# Patient Record
Sex: Female | Born: 1965 | Hispanic: Yes | Marital: Single | Smoking: Never smoker
Health system: Southern US, Community
[De-identification: ages and names within clinical notes are randomized; demographics above are authoritative.]

## PROBLEM LIST (undated history)

## (undated) DIAGNOSIS — Z9289 Personal history of other medical treatment: Secondary | ICD-10-CM

## (undated) DIAGNOSIS — N189 Chronic kidney disease, unspecified: Secondary | ICD-10-CM

## (undated) DIAGNOSIS — Z972 Presence of dental prosthetic device (complete) (partial): Secondary | ICD-10-CM

## (undated) DIAGNOSIS — D649 Anemia, unspecified: Secondary | ICD-10-CM

## (undated) DIAGNOSIS — R319 Hematuria, unspecified: Secondary | ICD-10-CM

## (undated) HISTORY — PX: TUBAL LIGATION: SHX77

## (undated) HISTORY — DX: Hematuria, unspecified: R31.9

## (undated) HISTORY — DX: Chronic kidney disease, unspecified: N18.9

## (undated) HISTORY — DX: Anemia, unspecified: D64.9

## (undated) SURGERY — ULTRASOUND, LOWER GI TRACT, ENDOSCOPIC
Anesthesia: General

---

## 2005-04-24 ENCOUNTER — Ambulatory Visit: Payer: Self-pay | Admitting: Obstetrics and Gynecology

## 2008-08-28 ENCOUNTER — Ambulatory Visit: Payer: Self-pay

## 2008-11-27 ENCOUNTER — Ambulatory Visit: Payer: Self-pay

## 2011-09-09 ENCOUNTER — Ambulatory Visit: Payer: Self-pay | Admitting: Internal Medicine

## 2016-02-12 ENCOUNTER — Inpatient Hospital Stay
Admission: EM | Admit: 2016-02-12 | Discharge: 2016-02-13 | DRG: 812 | Disposition: A | Discharge status: Home / Self Care | Payer: Medicaid Other | Attending: Internal Medicine | Admitting: Internal Medicine

## 2016-02-12 ENCOUNTER — Inpatient Hospital Stay: Payer: Medicaid Other

## 2016-02-12 ENCOUNTER — Encounter: Payer: Self-pay | Admitting: Emergency Medicine

## 2016-02-12 DIAGNOSIS — D509 Iron deficiency anemia, unspecified: Secondary | ICD-10-CM | POA: Diagnosis present

## 2016-02-12 DIAGNOSIS — Z9289 Personal history of other medical treatment: Secondary | ICD-10-CM

## 2016-02-12 DIAGNOSIS — R52 Pain, unspecified: Secondary | ICD-10-CM | POA: Diagnosis present

## 2016-02-12 DIAGNOSIS — K862 Cyst of pancreas: Secondary | ICD-10-CM | POA: Insufficient documentation

## 2016-02-12 DIAGNOSIS — D500 Iron deficiency anemia secondary to blood loss (chronic): Secondary | ICD-10-CM | POA: Insufficient documentation

## 2016-02-12 DIAGNOSIS — Z9851 Tubal ligation status: Secondary | ICD-10-CM

## 2016-02-12 DIAGNOSIS — D649 Anemia, unspecified: Secondary | ICD-10-CM | POA: Diagnosis present

## 2016-02-12 DIAGNOSIS — N920 Excessive and frequent menstruation with regular cycle: Secondary | ICD-10-CM | POA: Diagnosis present

## 2016-02-12 HISTORY — DX: Personal history of other medical treatment: Z92.89

## 2016-02-12 LAB — HEPATIC FUNCTION PANEL
ALK PHOS: 54 U/L (ref 38–126)
ALT: 30 U/L (ref 14–54)
AST: 22 U/L (ref 15–41)
Albumin: 3.1 g/dL — ABNORMAL LOW (ref 3.5–5.0)
BILIRUBIN INDIRECT: 1.1 mg/dL — AB (ref 0.3–0.9)
Bilirubin, Direct: 0.2 mg/dL (ref 0.1–0.5)
TOTAL PROTEIN: 5.6 g/dL — AB (ref 6.5–8.1)
Total Bilirubin: 1.3 mg/dL — ABNORMAL HIGH (ref 0.3–1.2)

## 2016-02-12 LAB — IRON AND TIBC: TIBC: 401 ug/dL (ref 250–450)

## 2016-02-12 LAB — CBC
HEMATOCRIT: 16.1 % — AB (ref 35.0–47.0)
HEMOGLOBIN: 4.6 g/dL — AB (ref 12.0–16.0)
MCH: 14.9 pg — ABNORMAL LOW (ref 26.0–34.0)
MCHC: 28.3 g/dL — ABNORMAL LOW (ref 32.0–36.0)
MCV: 52.7 fL — AB (ref 80.0–100.0)
Platelets: 351 10*3/uL (ref 150–440)
RBC: 3.05 MIL/uL — ABNORMAL LOW (ref 3.80–5.20)
RDW: 20.3 % — ABNORMAL HIGH (ref 11.5–14.5)
WBC: 4.7 10*3/uL (ref 3.6–11.0)

## 2016-02-12 LAB — RETICULOCYTES
RBC.: 3.09 MIL/uL — ABNORMAL LOW (ref 3.80–5.20)
RETIC CT PCT: 3.1 % (ref 0.4–3.1)
Retic Count, Absolute: 95.8 10*3/uL (ref 19.0–183.0)

## 2016-02-12 LAB — BASIC METABOLIC PANEL
ANION GAP: 5 (ref 5–15)
BUN: 14 mg/dL (ref 6–20)
CHLORIDE: 111 mmol/L (ref 101–111)
CO2: 20 mmol/L — ABNORMAL LOW (ref 22–32)
Calcium: 8.6 mg/dL — ABNORMAL LOW (ref 8.9–10.3)
Creatinine, Ser: 1.15 mg/dL — ABNORMAL HIGH (ref 0.44–1.00)
GFR calc Af Amer: >60 mL/min (ref >60)
GFR, EST NON AFRICAN AMERICAN: 55 mL/min — AB (ref >60)
Glucose, Bld: 104 mg/dL — ABNORMAL HIGH (ref 65–99)
POTASSIUM: 4 mmol/L (ref 3.5–5.1)
SODIUM: 136 mmol/L (ref 135–145)

## 2016-02-12 LAB — HEMOGLOBIN: HEMOGLOBIN: 6.3 g/dL — AB (ref 12.0–16.0)

## 2016-02-12 LAB — ABO/RH: ABO/RH(D): A POS

## 2016-02-12 LAB — VITAMIN B12: VITAMIN B 12: 402 pg/mL (ref 180–914)

## 2016-02-12 LAB — LIPASE, BLOOD: LIPASE: 29 U/L (ref 11–51)

## 2016-02-12 LAB — PREPARE RBC (CROSSMATCH)

## 2016-02-12 LAB — FERRITIN: FERRITIN: 1 ng/mL — AB (ref 11–307)

## 2016-02-12 LAB — FOLATE: FOLATE: 14.5 ng/mL (ref >5.9)

## 2016-02-12 MED ORDER — SODIUM CHLORIDE 0.9% FLUSH
3.0000 mL | Freq: Two times a day (BID) | INTRAVENOUS | Status: DC
  Administered 2016-02-12 – 2016-02-13 (×3): 3 mL via INTRAVENOUS

## 2016-02-12 MED ORDER — PANTOPRAZOLE SODIUM 40 MG PO TBEC
40.0000 mg | DELAYED_RELEASE_TABLET | Freq: Every day | ORAL | Status: DC
  Administered 2016-02-12 – 2016-02-13 (×2): 40 mg via ORAL
  Filled 2016-02-12 (×2): qty 1

## 2016-02-12 MED ORDER — SENNOSIDES-DOCUSATE SODIUM 8.6-50 MG PO TABS: 1.0000 | ORAL_TABLET | Freq: Every evening | ORAL | Status: DC | PRN

## 2016-02-12 MED ORDER — ACETAMINOPHEN 325 MG PO TABS: 650.0000 mg | ORAL_TABLET | Freq: Four times a day (QID) | ORAL | Status: DC | PRN

## 2016-02-12 MED ORDER — ACETAMINOPHEN 650 MG RE SUPP: 650.0000 mg | Freq: Four times a day (QID) | RECTAL | Status: DC | PRN

## 2016-02-12 MED ORDER — SODIUM CHLORIDE 0.9 % IV SOLN
10.0000 mL/h | Freq: Once | INTRAVENOUS | Status: AC
  Administered 2016-02-12: 10 mL/h via INTRAVENOUS

## 2016-02-12 MED ORDER — SODIUM CHLORIDE 0.9 % IV SOLN
Freq: Once | INTRAVENOUS | Status: AC
  Administered 2016-02-13: 02:00:00 via INTRAVENOUS

## 2016-02-12 MED ORDER — ALUM & MAG HYDROXIDE-SIMETH 200-200-20 MG/5ML PO SUSP: 30.0000 mL | Freq: Four times a day (QID) | ORAL | Status: DC | PRN

## 2016-02-12 MED ORDER — ONDANSETRON HCL 4 MG/2ML IJ SOLN: 4.0000 mg | Freq: Four times a day (QID) | INTRAMUSCULAR | Status: DC | PRN

## 2016-02-12 MED ORDER — ONDANSETRON HCL 4 MG PO TABS: 4.0000 mg | ORAL_TABLET | Freq: Four times a day (QID) | ORAL | Status: DC | PRN

## 2016-02-12 NOTE — ED Notes (Signed)
Pt presents with abdominal pain RUQ x 1 month. She was seen at PCP and had a follow up yesterday with bloodwork. Was told she needed to come to ED, as she is anemic. Pt reports black, tarry stools at times, and fatigue x 3 weeks. Up until 1 month ago, pt walked 3 miles per day, but has been too tired to do that lately.

## 2016-02-12 NOTE — ED Notes (Signed)
Dr Derrill KayGoodman at bedside; rectal exam performed. Interpreter present as Dr, Derrill KayGoodman discusses blood transfusion risks and benefits.

## 2016-02-12 NOTE — Consult Note (Addendum)
Consult History and Physical   SERVICE: Gynecology  Patient Name: Lindsay Bates Patient MRN:   [id number]  CC: low hemoglobin  HPI: Lindsay Bates is a 50 y.o. who presents from her PCP's office with low hemoglobin on routine labs.  She was evaluated and admitted by our hospitalists with a H/H of 4.6/16.1 and has been thus far given 2u PRBCs. So far no source of acute blood loss has been identified, and there is question if her heavy menses may have been contributing to this.  I was consulted to offer possible etiology of her anemia from a gynecology perspective.    Lindsay Bates is a [id number] s/p BTL 10 yrs ago who has always had heavy menses, which would usually last 3-5 days.  She has had an increase in the volume of blood loss in the last 3 cycles.  She says she fills about pad an hour on the first two days of her period and then it lightens up.  She has no intramenstrual bleeding.  She has had no recent trauma, no melena, no evidence of recent bleeding.  Her only symptom of note was a slight dyspnea on exertion when pushing boxes - she is a Neurosurgeon and makes socks.  She does endorse hearing her heartbeat in her ears, but no ringing in the ears, no lightheaded or dizziness, no feeling faint, no syncope, and no weakness.   She is s/p 2 u PRBCs with repeat H/H pending.  She states she feels Bates after the transfusion.    Although Lindsay Bates speaks limited English, we were able to converse without difficulty.  Review of Systems: positives in bold GEN:   fevers, chills, weight changes, appetite changes, fatigue, night sweats HEENT:  HA, vision changes, hearing loss, congestion, rhinorrhea, sinus pressure, dysphagia CV:   CP, palpitations PULM:  SOB, cough GI:  abd pain, N/V/D/C GU:  dysuria, urgency, frequency MSK:  arthralgias, myalgias, back pain, swelling SKIN:  rashes, color changes, pallor NEURO:  numbness, weakness, tingling, seizures, dizziness, tremors PSYCH:  depression,  anxiety, behavioral problems, confusion  HEME/LYMPH:  easy bruising or bleeding ENDO:  heat/cold intolerance  Past Obstetrical History: OB History    3x SVD no complcations, oldest child 74, youngest 79.      Past Gynecologic History: Patient's last menstrual period was 02/07/2016 (exact date). Menstrual frequency Q 28days lasting3-5 days requiring 8 pads/day, last pap was 42yr ago, gets them regularly, no abnormals.  No history of STI.  BTL performed 10 years ago.  Past Medical History: History reviewed. No pertinent past medical history.  Past Surgical History:  History reviewed. No pertinent past surgical history.  Family History:  family history is not on file.  Social History:  Social History   Social History  . Marital Status: Single - lives with boyfriend of 16 years, have 2 children together.    Spouse Name: N/A  . Number of Children: N/A  . Years of Education: N/A   Occupational History  . Seamstress   Social History Main Topics  . Smoking status: Never Smoker   . Smokeless tobacco: Not on file  . Alcohol Use: No  . Drug Use: No  . Sexual Activity: active   Other Topics Concern  . Not on file   Social History Narrative  . No narrative on file    Home Medications:  Medications reconciled in EPIC  No current facility-administered medications on file prior to encounter.   No current outpatient prescriptions on file prior to  encounter.    Allergies:  No Known Allergies  Physical Exam:  Temp:  [97.6 F (36.4 C)-98.9 F (37.2 C)] 98.1 F (36.7 C) (03/15 1943) Pulse Rate:  [61-77] 73 (03/15 1943) Resp:  [15-20] 15 (03/15 1943) BP: (116-138)/(58-78) 125/71 mmHg (03/15 1943) SpO2:  [97 %-100 %] 100 % (03/15 1943) Weight:  [63.05 kg (139 lb)-64.864 kg (143 lb)] 64.864 kg (143 lb) (03/15 1304)   General Appearance:  Well developed, well nourished, no acute distress, alert and oriented x3 HEENT:  Normocephalic atraumatic, extraocular movements  intact, moist mucous membranes Cardiovascular:  Normal S1/S2, regular rate and rhythm, no murmurs Pulmonary:  clear to auscultation, no wheezes, rales or rhonchi, symmetric air entry, good air exchange Abdomen:  Bowel sounds present, soft, nontender, nondistended, no abnormal masses, no epigastric pain Extremities:  Full range of motion, no pedal edema, 2+ distal pulses, no tenderness Skin:  normal coloration and turgor, no rashes, no suspicious skin lesions noted  Neurologic:  Cranial nerves 2-12 grossly intact, normal muscle tone, strength 5/5 all four extremities Psychiatric:  Normal mood and affect, appropriate, no AH/VH Pelvic:  NEFG, no vulvar masses or lesions, normal vaginal mucosa, no vaginal bleeding or discharge, cervix without lesions or erythema, small, anteverted uterus, no adnexal masses appreciated, no pelvic organ prolapse   Labs/Studies:   Results for orders placed or performed during the hospital encounter of 02/12/16 (from the past 24 hour(s))  Basic metabolic panel     Status: Abnormal   Collection Time: 02/12/16  9:26 AM  Result Value Ref Range   Sodium 136 135 - 145 mmol/L   Potassium 4.0 3.5 - 5.1 mmol/L   Chloride 111 101 - 111 mmol/L   CO2 20 (L) 22 - 32 mmol/L   Glucose, Bld 104 (H) 65 - 99 mg/dL   BUN 14 6 - 20 mg/dL   Creatinine, Ser 1.61 (H) 0.44 - 1.00 mg/dL   Calcium 8.6 (L) 8.9 - 10.3 mg/dL   GFR calc non Af Amer 55 (L) >60 mL/min   GFR calc Af Amer >60 >60 mL/min   Anion gap 5 5 - 15  CBC     Status: Abnormal   Collection Time: 02/12/16  9:26 AM  Result Value Ref Range   WBC 4.7 3.6 - 11.0 K/uL   RBC 3.05 (L) 3.80 - 5.20 MIL/uL   Hemoglobin 4.6 (LL) 12.0 - 16.0 g/dL   HCT 09.6 (L) 04.5 - 40.9 %   MCV 52.7 (L) 80.0 - 100.0 fL   MCH 14.9 (L) 26.0 - 34.0 pg   MCHC 28.3 (L) 32.0 - 36.0 g/dL   RDW 81.1 (H) 91.4 - 78.2 %   Platelets 351 150 - 440 K/uL  ABO/Rh     Status: None   Collection Time: 02/12/16  9:53 AM  Result Value Ref Range    ABO/RH(D) A POS   Prepare RBC     Status: None   Collection Time: 02/12/16  9:55 AM  Result Value Ref Range   Order Confirmation      CALLED MARY NEEDHAM @ 1037 ON 02/12/2016 NO RED BLOOD EXCHANGE NEEDED. CALLED GREG MOYER @ 1107 ON 02/12/2016 HE STATED THAT THE UNITS WERE GOING TO BE RECEIVED BY PATIENT ON THE FLOOR. CALLED GREG MOYER THAT UNITS WERE READY FOR PICK UP.  Type and screen [facility]     Status: None (Preliminary result)   Collection Time: 02/12/16 10:04 AM  Result Value Ref Range   ABO/RH(D) A  POS    Antibody Screen NEG    Sample Expiration 02/15/2016    Unit Number [id number]    Blood Component Type RBC, LR IRR    Unit division 00    Status of Unit ISSUED    Transfusion Status OK TO TRANSFUSE    Crossmatch Result Compatible    Unit Number [id number]    Blood Component Type RED CELLS,LR    Unit division 00    Status of Unit ISSUED    Transfusion Status OK TO TRANSFUSE    Crossmatch Result Compatible   Hepatic function panel     Status: Abnormal   Collection Time: 02/12/16 10:07 AM  Result Value Ref Range   Total Protein 5.6 (L) 6.5 - 8.1 g/dL   Albumin 3.1 (L) 3.5 - 5.0 g/dL   AST 22 15 - 41 U/L   ALT 30 14 - 54 U/L   Alkaline Phosphatase 54 38 - 126 U/L   Total Bilirubin 1.3 (H) 0.3 - 1.2 mg/dL   Bilirubin, Direct 0.2 0.1 - 0.5 mg/dL   Indirect Bilirubin 1.1 (H) 0.3 - 0.9 mg/dL  Lipase, blood     Status: None   Collection Time: 02/12/16 10:07 AM  Result Value Ref Range   Lipase 29 11 - 51 U/L  Ferritin     Status: Abnormal   Collection Time: 02/12/16 10:07 AM  Result Value Ref Range   Ferritin 1 (L) 11 - 307 ng/mL  Folate     Status: None   Collection Time: 02/12/16 10:07 AM  Result Value Ref Range   Folate 14.5 >5.9 ng/mL  Iron and TIBC     Status: Abnormal   Collection Time: 02/12/16 10:07 AM  Result Value Ref Range   Iron <5 (L) 28 - 170 ug/dL   TIBC 308401 657250 - 846450 ug/dL   Saturation Ratios NOT CALCULATED 10.4 - 31.8 %    UIBC NOT CALCULATED ug/dL  Reticulocytes     Status: Abnormal   Collection Time: 02/12/16 10:07 AM  Result Value Ref Range   Retic Ct Pct 3.1 0.4 - 3.1 %   RBC. 3.09 (L) 3.80 - 5.20 MIL/uL   Retic Count, Manual 95.8 19.0 - 183.0 K/uL  Vitamin B12     Status: None   Collection Time: 02/12/16 10:11 AM  Result Value Ref Range   Vitamin B-12 402 180 - 914 pg/mL  Hemoglobin     Status: Abnormal   Collection Time: 02/12/16  9:07 PM  Result Value Ref Range   Hemoglobin 6.3 (L) 12.0 - 16.0 g/dL     TVUS:  Not done Other Imaging: [place]as Abdomen Limited Ruq  02/12/2016  CLINICAL DATA:  Abdominal pain for 3 weeks, no nausea or vomiting EXAM: US ABDOMEN LIMITED - RIGHT UPPER QUADRANT COMPARISON:  None FINDINGS: Gallbladder: Normally distended without stones or wall thickening. No pericholecystic fluid or sonographic Murphy sign. Common bile duct: Diameter: Normal caliber 2 mm diameter Liver: Normal echogenicity without mass.  Hepatopetal portal venous flow. No RIGHT upper quadrant free fluid. Question cystic lesion at pancreatic head, septated, 2.2 x 1.6 x 1.3 cm. IMPRESSION: No hepatobiliary abnormalities identified. 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 Electronically Signed   By: Ulyses SouthwardMark  Boles M.D.   On: 02/12/2016 11:11     Assessment / Plan:   Mahealani Erekson is a 50 y.o. G3P3 with chronic severe iron deficiency anemia.  1. Anemia:  Based on lab studies and clinical history this is not an acute blood loss situation.  She has likely been anemic for some time and has been physically adjusting to a very low hemoglobin.  As such, she is only symptomatic when pushed in questioning.  Her vital signs do not reflect an acute situation either.  While a gynecologic source is a valid question, further investigation must be made for another etiology (possibly compounding) such as poor absorption, poor  nutrition, malignancy, and/or a hematologic synthesis or iron storage deficiency.  A heme consult should be considered.  After 2 units, she has had adequate rise to 6.3 but this is still grossly anemic.  Another 2 units would suit her, as well as an iron or EPO infusion prior to discharge.  2. Gynecology:  Her heavy menses are likely contributing to her current anemia, but there is no treatment that needs to be started as an inpatient or emergently, as she is not currently bleeding. I would like to obtain a pelvic ultrasound which can be done before discharge, which I have ordered.  Her exam is not concerning.   I have asked Ms. Halberg to follow up with me in the week or two after discharge so we can address her heavy menses.  I am signing off while she remains inpatient, but certainly am curious to learn what further investigation finds.  If I can be of any help during this process please do not hesitate to contact me directly.    Total time invested in this patient's care, 60 minutes with >7mins face to face.   Thank you for the opportunity to be involved with this patient's care.   ----- Ranae Plumber, MD Attending Obstetrician and Gynecologist [facility] Pager: [phone]

## 2016-02-12 NOTE — ED Notes (Signed)
Consent signed for blood products.

## 2016-02-12 NOTE — ED Notes (Signed)
Sent by pcp-charles drew for low iron

## 2016-02-12 NOTE — H&P (Signed)
[facility] [place] [facility]   PATIENT NAME: Lindsay Bates    MR#:  [id number]  DATE OF BIRTH:  [birth year]  DATE OF ADMISSION:  02/12/2016  PRIMARY CARE PHYSICIAN: [facility]   REQUESTING/REFERRING PHYSICIAN: Dr Derrill KayGoodman  CHIEF COMPLAINT:   Low hemoglobin HISTORY OF PRESENT ILLNESS:  Lindsay Abrahamsdilma Grewell  is a 50 y.o. female with no past medical history who presents from primary care physician's office after routine blood work was performed showing low hemoglobin. Patient reports that a PEG. She had 9 heavy days of menses. She denies hematochezia or hematuria. Her guaiac test was negative in the emergency room. She also reports she has had fatigue and dyspnea on exertion. She does not take NSAIDs.  PAST MEDICAL HISTORY:  None  PAST SURGICAL HISTORY:  None  SOCIAL HISTORY:   Social History  Substance Use Topics  . Smoking status: Never Smoker   . Smokeless tobacco: No  . Alcohol Use: No    FAMILY HISTORY:  No history of CAD, hypertension or diabetes  DRUG ALLERGIES:  No Known Allergies   REVIEW OF SYSTEMS:  CONSTITUTIONAL: No fever, positive fatigue no weakness.  EYES: No blurred or double vision.  EARS, NOSE, AND THROAT: No tinnitus or ear pain.  RESPIRATORY: No cough, shortness of breath, wheezing or hemoptysis. Positive dyspnea exertion CARDIOVASCULAR: No chest pain, orthopnea, edema.  GASTROINTESTINAL: No nausea, vomiting, diarrhea positive for right upper quadrant abdominal pain.  GENITOURINARY: No dysuria, hematuria.  ENDOCRINE: No polyuria, nocturia,  HEMATOLOGY: No anemia, easy bruising or bleeding SKIN: No rash or lesion. MUSCULOSKELETAL: No joint pain or arthritis.   NEUROLOGIC: No tingling, numbness, weakness.  PSYCHIATRY: No anxiety or depression.   MEDICATIONS AT HOME:      VITAL SIGNS:  Blood pressure 138/62, pulse 62, temperature 97.6 F (36.4 C), temperature source Oral, resp. rate 18, height 5\' 3"  (1.6  m), weight 63.05 kg (139 lb), last menstrual period 02/07/2016, SpO2 100 %.  PHYSICAL EXAMINATION:  GENERAL:  50 y.o.-year-old patient lying in the bed with no acute distress.  EYES: Pupils equal, round, reactive to light and accommodation. No scleral icterus. Extraocular muscles intact.  HEENT: Head atraumatic, normocephalic. Oropharynx and nasopharynx clear.  NECK:  Supple, no jugular venous distention. No thyroid enlargement, no tenderness.  LUNGS: Normal breath sounds bilaterally, no wheezing, rales,rhonchi or crepitation. No use of accessory muscles of respiration.  CARDIOVASCULAR: S1, S2 normal. No murmurs, rubs, or gallops.  ABDOMEN: Soft, nontender, nondistended. Bowel sounds present. No organomegaly or mass.  EXTREMITIES: No pedal edema, cyanosis, or clubbing.  NEUROLOGIC: Cranial nerves II through XII are grossly intact. No focal deficits. PSYCHIATRIC: The patient is alert and oriented x 3.  SKIN: No obvious rash, lesion, or ulcer. She looks pale  LABORATORY PANEL:   CBC  Recent Labs Lab 02/12/16 0926  WBC 4.7  HGB 4.6*  HCT 16.1*  PLT 351   ------------------------------------------------------------------------------------------------------------------  Chemistries   Recent Labs Lab 02/12/16 0926  NA 136  K 4.0  CL 111  CO2 20*  GLUCOSE 104*  BUN 14  CREATININE 1.15*  CALCIUM 8.6*   ------------------------------------------------------------------------------------------------------------------  Cardiac Enzymes No results for input(s): TROPONINI in the last 168 hours. ------------------------------------------------------------------------------------------------------------------  RADIOLOGY:  No results found.  EKG:  No orders found for this or any previous visit.  IMPRESSION AND PLAN:    50 year old female with a past medical history who presents from her primary care physician's office with anemia.  1. Acute blood loss  anemia: Anemia panel  has been ordered. Patient has been consented for 2 units of blood. I will order more guaiac testing here. Patient denies symptoms of a GI bleed. Patient will need OB/GYN consultation as this is may be due to fibroids. Further workup following anemia panel.  2. Right upper carotid abdominal pain: Check liver function tests and right upper quad ultrasound which was ordered by the ER physician.    All the records are reviewed and case discussed with ED provider. Management plans discussed with the patient and she is in agreement.  CODE STATUS: FULL  TOTAL TIME TAKING CARE OF THIS PATIENT: 45 minutes.    Terissa Haffey M.D on 02/12/2016 at 10:10 AM  Between 7am to 6pm - Pager - [phone] After 6pm go to [email or web address] - password [email or web address]  [facility]  Office  [phone]  CC: Primary care physician; [facility]

## 2016-02-12 NOTE — ED Notes (Signed)
Pt transferred upstairs by Gerilyn PilgrimJacob, ED Tech. Pt went with 2 bags of belongings that included shoes and clothes. Pt family to accompany pt to floor. Pt alert and oriented X4, active, cooperative, pt in NAD. RR even and unlabored, color WNL.

## 2016-02-12 NOTE — ED Provider Notes (Signed)
[facility] Emergency Department Provider Note   ____________________________________________  Time seen: ~0935  I have reviewed the triage vital signs and the nursing notes.   HISTORY  Chief Complaint Fatigue   History limited by: Language [facility] Interpreter utilized   HPI Lindsay Bates is a 50 y.o. female who presents to the emergency department today because of concerns for "low iron". Patient states that she had blood work done yesterday at her primary care doctor's office. She had this done as part of a follow-up visit for abdominal pain. She describes abdominal pain as being located in the right upper quadrant. She states it was worse with big meals. She has sinus some darkening of her stool. In addition the patient states she had a longer than normal period. It lasted 9 days. She has felt more fatigued the past 3 weeks. She says this is worse with exertion.   History reviewed. No pertinent past medical history.  There are no active problems to display for this patient.   History reviewed. No pertinent past surgical history.  No current outpatient prescriptions on file.  Allergies Review of patient's allergies indicates no known allergies.  No family history on file.  Social History Social History  Substance Use Topics  . Smoking status: Never Smoker   . Smokeless tobacco: None  . Alcohol Use: No    Review of Systems  Constitutional: Negative for fever. Cardiovascular: Negative for chest pain. Respiratory: Positive for shortness of breath. Gastrointestinal: Positive for abdominal pain Neurological: Negative for headaches, focal weakness or numbness.  10-point ROS otherwise negative.  ____________________________________________   PHYSICAL EXAM:  VITAL SIGNS: ED Triage Vitals  Enc Vitals Group     BP 02/12/16 0902 138/62 mmHg     Pulse Rate 02/12/16 0902 62     Resp 02/12/16 0902 18     Temp 02/12/16 0902 97.6  F (36.4 C)     Temp Source 02/12/16 0902 Oral     SpO2 02/12/16 0902 100 %     Weight 02/12/16 0902 139 lb (63.05 kg)     Height 02/12/16 0902  (1.6 m)     Head Cir --      Peak Flow --      Pain Score 02/12/16 0907 5   Constitutional: Alert and oriented. Well appearing and in no distress. Eyes: Conjunctivae are normal. PERRL. Normal extraocular movements. ENT   Head: Normocephalic and atraumatic.   Nose: No congestion/rhinnorhea.   Mouth/Throat: Mucous membranes are moist.   Neck: No stridor. Hematological/Lymphatic/Immunilogical: No cervical lymphadenopathy. Cardiovascular: Normal rate, regular rhythm.  No murmurs, rubs, or gallops. Respiratory: Normal respiratory effort without tachypnea nor retractions. Breath sounds are clear and equal bilaterally. No wheezes/rales/rhonchi. Gastrointestinal: Soft and minimally tender in the right upper quadrant. No rebound. No guarding. Guaiac negative. Genitourinary: Deferred Musculoskeletal: Normal range of motion in all extremities. No joint effusions.  No lower extremity tenderness nor edema. Neurologic:  Normal speech and language. No gross focal neurologic deficits are appreciated.  Skin:  Skin is warm, dry and intact. No rash noted. Psychiatric: Mood and affect are normal. Speech and behavior are normal. Patient exhibits appropriate insight and judgment.  ____________________________________________    LABS (pertinent positives/negatives)  Hgb 4.6  ____________________________________________   EKG  None  ____________________________________________    RADIOLOGY  None  ____________________________________________   PROCEDURES  Procedure(s) performed: None  Critical Care performed: Yes, see critical care note(s)  CRITICAL CARE Performed by: Phineas Semen  Total critical care time: 30 minutes  Critical care time was exclusive of separately billable procedures and treating other  patients.  Critical care was necessary to treat or prevent imminent or life-threatening deterioration.  Critical care was time spent personally by me on the following activities: development of treatment plan with patient and/or surrogate as well as nursing, discussions with consultants, evaluation of patient's response to treatment, examination of patient, obtaining history from patient or surrogate, ordering and performing treatments and interventions, ordering and review of laboratory studies, ordering and review of radiographic studies, pulse oximetry and re-evaluation of patient's condition.  ____________________________________________   INITIAL IMPRESSION / ASSESSMENT AND PLAN / ED COURSE  Pertinent labs & imaging results that were available during my care of the patient were reviewed by me and considered in my medical decision making (see chart for details).  Patient presented to the emergency department today because of concerns for "low iron". On exam patient is pale. Guaiac was negative. Hemoglobin was 4.6. I did discuss with the patient risk and benefits of blood transfusion. Patient did state that she had a longer than usual. Last month which could be the source of the anemia. Will plan on admission to the hospital service for further workup and management.  ____________________________________________   FINAL CLINICAL IMPRESSION(S) / ED DIAGNOSES  Final diagnoses:  Anemia, unspecified anemia type     Phineas SemenGraydon Kiowa Peifer, MD 02/12/16 1133

## 2016-02-13 ENCOUNTER — Inpatient Hospital Stay: Payer: Medicaid Other

## 2016-02-13 LAB — PREPARE RBC (CROSSMATCH)

## 2016-02-13 LAB — OCCULT BLOOD X 1 CARD TO LAB, STOOL: Fecal Occult Bld: NEGATIVE

## 2016-02-13 MED ORDER — TAB-A-VITE/IRON PO TABS
1.0000 | ORAL_TABLET | Freq: Every day | ORAL | Status: DC
  Administered 2016-02-13: 1 via ORAL
  Filled 2016-02-13: qty 1

## 2016-02-13 MED ORDER — FERROUS SULFATE 325 (65 FE) MG PO TABS: 325.0000 mg | ORAL_TABLET | Freq: Three times a day (TID) | ORAL | Status: AC

## 2016-02-13 MED ORDER — TAB-A-VITE/IRON PO TABS: 1.0000 | ORAL_TABLET | Freq: Every day | ORAL | Status: AC

## 2016-02-13 MED ORDER — SODIUM CHLORIDE 0.9 % IV SOLN
500.0000 mg | Freq: Once | INTRAVENOUS | Status: AC
  Administered 2016-02-13: 13:00:00 500 mg via INTRAVENOUS
  Filled 2016-02-13: qty 25

## 2016-02-13 NOTE — Discharge Summary (Signed)
Lindsay Bates, 50 y.o., DOB June 26, 1966, MRN [id number]. 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 female with no past medical history who presents from primary care physician's office after routine blood work was performed showing low hemoglobin.  She had 9 heavy days of menses. She denies hematochezia or hematuria. Her guaiac test was negative in the emergency room. Patient was was admitted for transfusion. She received 2 units of transfusion her hemoglobin is improved. She is severely iron deficient. She was seen by GYN and they recommended outpatient follow-up. She has no evidence of GI bleed. She is given IV iron therapy. We'll follow-up with hematology as outpatient. At that time can have her CBC checked as well. She is very anxious to go home.2           Consults  gynecology  Significant Tests:  See full reports for all details    US Transvaginal Non-ob  02/13/2016  CLINICAL DATA:  Heavy menses for 1 month. Gravida 4 para 3. LMP 02/07/2016. EXAM: TRANSABDOMINAL AND TRANSVAGINAL ULTRASOUND OF PELVIS TECHNIQUE: Both transabdominal and transvaginal ultrasound examinations of the pelvis were performed. Transabdominal technique was performed for global imaging of the pelvis including uterus, ovaries, adnexal regions, and pelvic cul-de-sac. It was necessary to proceed with endovaginal exam following the transabdominal exam to visualize the endometrium, ovaries. COMPARISON:  None FINDINGS: Uterus Measurements: 10.0 x 5.3 x 6.9 cm. No fibroids or other mass  visualized. Endometrium Thickness: 4.0 mm.  No focal abnormality visualized. Right ovary Measurements: 2.5 x 2.5 x 2.3 cm. Normal appearance/no adnexal mass. Dominant follicle is 1.8 cm. Left ovary Measurements: 2.7 x 1.6 x 1.9 cm. Normal appearance/no adnexal mass. Other findings Small amount of free pelvic fluid is present. IMPRESSION: 1. Normal appearance of the uterus and endometrium. 2. Normal appearance of the endometrial stripe. If bleeding remains unresponsive to hormonal or medical therapy, sonohysterogram should be considered for focal lesion work-up. (Ref: Radiological Reasoning: Algorithmic Workup of Abnormal Vaginal Bleeding with Endovaginal Sonography and Sonohysterography. AJR 2008; 478:G95-62) 3. Small amount of free pelvic fluid likely physiologic. Electronically Signed   By: Norva Pavlov M.D.   On: 02/13/2016 12:09   US Pelvis Complete  02/13/2016  CLINICAL DATA:  Heavy menses for 1 month. Gravida 4 para 3. LMP 02/07/2016. EXAM: TRANSABDOMINAL AND TRANSVAGINAL ULTRASOUND OF PELVIS TECHNIQUE: Both transabdominal and transvaginal ultrasound examinations of the pelvis were performed. Transabdominal technique was performed for global imaging of the pelvis including uterus, ovaries, adnexal regions, and pelvic cul-de-sac. It was necessary to proceed with endovaginal exam following the transabdominal exam to visualize the endometrium, ovaries. COMPARISON:  None FINDINGS: Uterus Measurements: 10.0 x 5.3 x 6.9 cm. No fibroids or other mass visualized. Endometrium Thickness: 4.0 mm.  No focal abnormality visualized. Right ovary Measurements: 2.5 x 2.5 x 2.3 cm. Normal appearance/no adnexal mass. Dominant follicle is 1.8 cm. Left ovary Measurements: 2.7 x 1.6 x 1.9 cm. Normal appearance/no adnexal mass. Other findings Small amount of free pelvic fluid is present. IMPRESSION: 1. Normal appearance of the uterus and endometrium. 2. Normal appearance of the endometrial stripe. If bleeding remains  unresponsive to hormonal or medical therapy, sonohysterogram should be considered for focal lesion work-up. (Ref: Radiological Reasoning: Algorithmic Workup of Abnormal Vaginal Bleeding with Endovaginal Sonography and Sonohysterography. AJR 2008; 161:W96-04) 3. Small amount of free pelvic fluid likely physiologic. Electronically Signed   By: Norva Pavlov M.D.   On: 02/13/2016 12:09   US Abdomen Limited Ruq  02/12/2016  CLINICAL DATA:  Abdominal pain for 3 weeks, no nausea or vomiting EXAM: US ABDOMEN LIMITED - RIGHT UPPER QUADRANT COMPARISON:  None FINDINGS: Gallbladder: Normally distended without stones or wall thickening. No pericholecystic fluid or sonographic Murphy sign. Common bile duct: Diameter: Normal caliber 2 mm diameter Liver: Normal echogenicity without mass.  Hepatopetal portal venous flow. No RIGHT upper quadrant free fluid. Question cystic lesion at pancreatic head, septated, 2.2 x 1.6 x 1.3 cm. IMPRESSION: No hepatobiliary abnormalities identified. 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 Electronically Signed   By: Ulyses Southward M.D.   On: 02/12/2016 11:11       Today   Subjective:   Lindsay Bates  feels well denies any weakness or chest pain or shortness of breath  Objective:   Blood pressure 137/79, pulse 60, temperature 98.2 F (36.8 C), temperature source Oral, resp. rate 20, height  (1.6 m), weight 64.864 kg (143 lb), last menstrual period 02/07/2016, SpO2 99 %.  .  Intake/Output Summary (Last 24 hours) at 02/13/16 1334 Last data filed at 02/13/16 0940  Gross per 24 hour  Intake   1570 ml  Output   2000 ml  Net   -430 ml    Exam VITAL SIGNS: Blood pressure 137/79, pulse 60, temperature 98.2 F (36.8 C), temperature source Oral, resp. rate 20, height  (1.6 m), weight 64.864 kg (143 lb), last menstrual period 02/07/2016, SpO2 99 %.  GENERAL:   50 y.o.-year-old patient lying in the bed with no acute distress.  EYES: Pupils equal, round, reactive to light and accommodation. No scleral icterus. Extraocular muscles intact.  HEENT: Head atraumatic, normocephalic. Oropharynx and nasopharynx clear.  NECK:  Supple, no jugular venous distention. No thyroid enlargement, no tenderness.  LUNGS: Normal breath sounds bilaterally, no wheezing, rales,rhonchi or crepitation. No use of accessory muscles of respiration.  CARDIOVASCULAR: S1, S2 normal. No murmurs, rubs, or gallops.  ABDOMEN: Soft, nontender, nondistended. Bowel sounds present. No organomegaly or mass.  EXTREMITIES: No pedal edema, cyanosis, or clubbing.  NEUROLOGIC: Cranial nerves II through XII are intact. Muscle strength 5/5 in all extremities. Sensation intact. Gait not checked.  PSYCHIATRIC: The patient is alert and oriented x 3.  SKIN: No obvious rash, lesion, or ulcer.   Data Review     CBC w Diff: Lab Results  Component Value Date   WBC 4.7 02/12/2016   HGB 6.3* 02/12/2016   HCT 16.1* 02/12/2016   PLT 351 02/12/2016   CMP: Lab Results  Component Value Date   NA 136 02/12/2016   K 4.0 02/12/2016   CL 111 02/12/2016   CO2 20* 02/12/2016   BUN 14 02/12/2016   CREATININE 1.15* 02/12/2016   PROT 5.6* 02/12/2016   ALBUMIN 3.1* 02/12/2016   BILITOT 1.3* 02/12/2016   ALKPHOS 54 02/12/2016   AST 22 02/12/2016   ALT 30 02/12/2016  .  Micro Results No results found for this or any previous visit (from the past 240 hour(s)).      Code Status Orders  Start     Ordered   02/12/16 1154  Full code   Continuous     02/12/16 1153    Code Status History    Date Active Date Inactive Code Status Order ID Comments User Context   This patient has a current code status but no historical code status.          Follow-up Information    Follow up with Ward, Elenora Fenderhelsea C, MD On 02/19/2016.   Specialty:  Obstetrics and Gynecology   Why:  at 11:20, Follow-up  Appointment   Contact information:   [address] [place] [address] [phone]       Schedule an appointment as soon as possible for a visit with Earna CoderGovinda R Brahmanday, MD.   Specialties:  Internal Medicine, Oncology   Why:  para la anemia... llame a la oficina para hacer una cita   Contact information:   [address] [place] [address] [phone]       Discharge Medications     Medication List    TAKE these medications        acetaminophen 325 MG tablet  Commonly known as:  TYLENOL  Take 650 mg by mouth every 6 (six) hours as needed.     ferrous sulfate 325 (65 FE) MG tablet  Commonly known as:  IRON SUPPLEMENT  Take 1 tablet (325 mg total) by mouth 3 (three) times daily with meals.     multivitamins with iron Tabs tablet  Take 1 tablet by mouth daily.           Total Time in preparing paper work, data evaluation and todays exam - 35 minutes  Auburn BilberryPATEL, Hasten Sweitzer M.D on 02/13/2016 at 1:34 PM  [facility]   Office  [phone]

## 2016-02-13 NOTE — Progress Notes (Signed)
Note given. Patient discharged. Bo McclintockBrewer,Joelie Schou S, RN

## 2016-02-13 NOTE — Progress Notes (Signed)
Interpreter at bedside. Patient informed of US scheduled for today and instructed to drink water for full bladder. Bo McclintockBrewer,Jarret Torre S, RN

## 2016-02-13 NOTE — Discharge Instructions (Signed)
DIET:  Regular diet  DISCHARGE CONDITION:  Stable  ACTIVITY:  Activity as tolerated  OXYGEN:  Home Oxygen: No.   Oxygen Delivery: room air  DISCHARGE LOCATION:  home    ADDITIONAL DISCHARGE INSTRUCTION:   If you experience worsening of your admission symptoms, develop shortness of breath, life threatening emergency, suicidal or homicidal thoughts you must seek medical attention immediately by calling 911 or calling your MD immediately  if symptoms less severe.  You Must read complete instructions/literature along with all the possible adverse reactions/side effects for all the Medicines you take and that have been prescribed to you. Take any new Medicines after you have completely understood and accpet all the possible adverse reactions/side effects.   Please note  You were cared for by a hospitalist during your hospital stay. If you have any questions about your discharge medications or the care you received while you were in the hospital after you are discharged, you can call the unit and asked to speak with the hospitalist on call if the hospitalist that took care of you is not available. Once you are discharged, your primary care physician will handle any further medical issues. Please note that NO REFILLS for any discharge medications will be authorized once you are discharged, as it is imperative that you return to your primary care physician (or establish a relationship with a primary care physician if you do not have one) for your aftercare needs so that they can reassess your need for medications and monitor your lab values.   Transfusin de sangre  (Blood Transfusion) Neomia Dear transfusin de sangre es un procedimiento en el que se administra sangre donada a travs de una va intravenosa. Puede necesitar sangre por una enfermedad, ciruga o lesin. La sangre puede provenir de un donante. Tambin puede ser su propia sangre que haya donado previamente. La sangre que recibe se  compone de diferentes tipos de clulas. Puede recibir lo siguiente:   Glbulos rojos. Estos transportan oxgeno y Orthoptist perdida.   Plaquetas. Estas controlan el sangrado.   Plasma. Este ayuda a la coagulacin sangunea. Si tiene un trastorno de Biomedical scientist, tambin puede recibir otro tipo de hemoderivados.  ANTES DEL PROCEDIMIENTO  Posiblemente deba realizarse un anlisis de sangre para determinar qu tipo de [place] tiene y qu clase de sangre aceptar el cuerpo.   Si planifica someterse a [place], puede donar su propia sangre. Esto es en caso de que necesite una transfusin.   Si ha tenido Runner, broadcasting/film/video a una transfusin en el pasado, es posible que reciba medicamentos para ayudar a Comptroller. Tome los medicamentos solamente como se lo haya indicado el mdico.  Le controlarn la temperatura, la presin arterial y el pulso. PROCEDIMIENTO   Le insertarn una va intravenosa en el brazo o la [place].   La bolsa de sangre donada se conectar a la va intravenosa y Contractor en la vena.   Un mdico le controlar con frecuencia la temperatura, la presin arterial y el pulso durante el procedimiento. Esto se realiza para detectar signos tempranos de una reaccin a la transfusin.  Si tiene signos o sntomas de una reaccin, es posible que se suspenda el procedimiento y Engineer, materials administren medicamentos.   Cuando finalice la transfusin, le retirarn la va intravenosa.   Se puede aplicar presin en el lugar donde se coloc la va intravenosa.   Le colocarn una venda (vendaje).  Este procedimiento puede variar segn el mdico y el hospital.  DESPUS DEL PROCEDIMIENTO  Le controlarn con frecuencia la temperatura, la presin arterial y el pulso.   Esta informacin no tiene Theme park managercomo fin reemplazar el consejo del mdico. Asegrese de hacerle al mdico cualquier pregunta que tenga.   Document Released: 12/19/2010 Document Revised:  12/07/2014 Elsevier Interactive Patient Education 2016 [employer]  Transfusin de Har[place]angre, cuidados posteriores (Blood Transfusion, Care After) Siga estas instrucciones durante las prximas semanas. Estas indicaciones le proporcionan informacin acerca de cmo deber cuidarse despus del procedimiento. El mdico tambin podr darle instrucciones ms especficas. El tratamiento ha sido planificado segn las prcticas mdicas actuales, pero en algunos casos pueden ocurrir problemas. Comunquese con el mdico si tiene algn problema o tiene dudas despus del procedimiento. QU ESPERAR DESPUS DEL PROCEDIMIENTO Despus del procedimiento, es comn DIRECTVtener los siguientes sntomas:  Hematomas y Art therapistdolor en el lugar donde se coloc la va intravenosa (IV).  Fiebre o escalofros.  Dolor de Turkmenistancabeza. INSTRUCCIONES PARA EL CUIDADO EN EL HOGAR  Tome los medicamentos solamente como se lo haya indicado el mdico. Pregntele al mdico si puede tomar un analgsico de venta libre si tiene Teacher, English as a foreign languagefiebre o Engineer, miningdolor de cabeza uno o [address] das despus de la transfusin.  Reanude sus actividades normales como se lo haya indicado el mdico. SOLICITE ATENCIN MDICA SI:   Presenta enrojecimiento o irritacin en el lugar donde se coloc la va intravenosa.  Tiene fiebre, escalofros o dolor de cabeza persistentes.  La orina es ms oscura que lo normal.  La orina se vuelve rosa, roja o Thornportmarrn.   La parte blanca de los ojos se vuelve amarilla (ictericia).   Se siente dbil despus de realizar sus actividades habituales.  SOLICITE ATENCIN MDICA DE INMEDIATO SI:   Tiene dificultad para respirar.  Lance Mussiene fiebre y escalofros con:  Ansiedad.  Dolor de pecho o de espalda.  Piel irritada.  Piel hmeda.  Frecuencia cardaca rpida.  Nuseas.   Esta informacin no tiene Theme park managercomo fin reemplazar el consejo del mdico. Asegrese de hacerle al mdico cualquier pregunta que tenga.   Document Released:  12/07/2014 Elsevier Interactive Patient Education 2016 Elsevier Inc.  Anemia inespecfica (Anemia, Nonspecific) La anemia es una enfermedad en la que la concentracin de glbulos rojos o el nivel de hemoglobina en la sangre estn por debajo de lo normal. La hemoglobina es la sustancia de los glbulos rojos que lleva el oxgeno a todo el cuerpo. La anemia da como resultado que los tejidos no reciban la cantidad suficiente de oxgeno.  CAUSAS  Las causas ms frecuentes de anemia son:   Sharlyne PacasSangrado excesivo. El sangrado puede ser interno o externo. Incluye sangrado excesivo debido al perodo (en las mujeres) o por los intestinos.   Dficit nutricional.   Enfermedad renal, tiroidea o heptica crnicas.  Enfermedades de la mdula sea que disminuyen la produccin de glbulos rojos.  Cncer y tratamientos para Management consultantel cncer.  VIH, sida y sus tratamientos.  Trastornos del bazo que aumentan la destruccin de glbulos rojos.  Enfermedades de Clear Channel Communicationsla sangre.  Destruccin excesiva de glbulos rojos debido a una infeccin, a medicamentos y a Chartered loss adjusterenfermedades autoinmunes. SIGNOS Y SNTOMAS   Debilidad leve.   Mareos.   Dolor de Turkmenistancabeza.  Palpitaciones.   Falta de aire, especialmente con el ejercicio.   Palidez.  Sensibilidad al fro.  Indigestin.  Nuseas.  Dificultad para dormir.  Dificultad para concentrarse. Los sntomas pueden ocurrir repentinamente o pueden Medical illustratordesarrollarse lentamente.  DIAGNSTICO  Con frecuencia es necesario realizar anlisis de [place] Mediasangre adicionales. Estos ayudan al profesional a Chief Strategy Officerdeterminar  el mejor tratamiento. Su mdico controlar la materia fecal para Engineer, manufacturing la presencia de [place] y buscar otras causas de prdida de [place].  TRATAMIENTO  El tratamiento vara segn la causa de la anemia. Las opciones de tratamiento son:   Suplementos de hierro, vitamina B12, o cido flico.   Medicamentos con hormonas.   Transfusin de [place]. Ser necesaria en los casos  de prdida de [place] grave.   Hospitalizacin. Ser necesaria si la prdida de sangre es continua y significativa.   Cambios en la dieta.  Extirpacin del bazo. INSTRUCCIONES PARA EL CUIDADO EN EL HOGAR Cumpla con todas las visitas de control. Generalmente demora varias semanas corregir la anemia, y es muy importante que el mdico controle su enfermedad y su respuesta al [place]. SOLICITE ATENCIN MDICA DE INMEDIATO SI:   Siente debilidad extrema, falta de aire o dolor en el pecho.   Se siente mareado o tiene dificultad para concentrarse.  Tiene una hemorragia vaginal abundante.   Aparece una erupcin cutnea.   La materia fecal es negra, de aspecto alquitranado.   Se desmaya.   Vomita sangre.   Vomita repetidas veces.   Siente dolor abdominal.  Tiene fiebre o sntomas persistentes durante ms de 2 - 3 das.   Tiene fiebre y los sntomas empeoran repentinamente.   Se deshidrata.  ASEGRESE DE QUE:  Comprende estas instrucciones.  Controlar su afeccin.  Recibir ayuda de inmediato si no mejora o si empeora.   Esta informacin no tiene Theme park manager el consejo del mdico. Asegrese de hacerle al mdico cualquier pregunta que tenga.   Document Released: 11/16/2005 Document Revised: 07/19/2013 Elsevier Interactive Patient Education [employer]

## 2016-02-13 NOTE — Progress Notes (Signed)
[facility] [place] [facility]        Lindsay Bates was admitted to the Hospital on 02/12/2016 and Discharged  02/13/2016 and should be excused from work/school   for 5 days starting 02/12/2016 , may return to work/school without any restrictions.  Call Lindsay SaranSital Dekker Verga MD with questions.  Lindsay Bates M.D on 02/13/2016,at 5:21 PM  [facility] [place] [facility]    Office  [phone]

## 2016-02-13 NOTE — Progress Notes (Signed)
Interpreter present. Discharge instructions given and went over with patient and family at bedside. Prescriptions given. All questions answered. Patient to be discharged home - requesting an excuse for work note, MD notified. Bo McclintockBrewer,Latricia Cerrito S, RN

## 2016-02-14 LAB — TYPE AND SCREEN
ABO/RH(D): A POS
Antibody Screen: NEGATIVE
UNIT DIVISION: 0
UNIT DIVISION: 0
Unit division: 0
Unit division: 0

## 2016-02-24 ENCOUNTER — Inpatient Hospital Stay: Payer: Self-pay

## 2016-02-24 ENCOUNTER — Inpatient Hospital Stay: Payer: Self-pay | Attending: Hematology and Oncology | Admitting: Hematology and Oncology

## 2016-02-24 ENCOUNTER — Encounter: Payer: Self-pay | Admitting: Hematology and Oncology

## 2016-02-24 VITALS — BP 155/83 | HR 50 | Temp 97.5°F | Resp 20 | Ht 63.0 in | Wt 140.4 lb

## 2016-02-24 DIAGNOSIS — D509 Iron deficiency anemia, unspecified: Secondary | ICD-10-CM

## 2016-02-24 DIAGNOSIS — R634 Abnormal weight loss: Secondary | ICD-10-CM | POA: Insufficient documentation

## 2016-02-24 DIAGNOSIS — N920 Excessive and frequent menstruation with regular cycle: Secondary | ICD-10-CM | POA: Insufficient documentation

## 2016-02-24 DIAGNOSIS — R5383 Other fatigue: Secondary | ICD-10-CM | POA: Insufficient documentation

## 2016-02-24 DIAGNOSIS — R233 Spontaneous ecchymoses: Secondary | ICD-10-CM | POA: Insufficient documentation

## 2016-02-24 DIAGNOSIS — R0602 Shortness of breath: Secondary | ICD-10-CM | POA: Insufficient documentation

## 2016-02-24 DIAGNOSIS — R109 Unspecified abdominal pain: Secondary | ICD-10-CM | POA: Insufficient documentation

## 2016-02-24 DIAGNOSIS — R238 Other skin changes: Secondary | ICD-10-CM | POA: Insufficient documentation

## 2016-02-24 DIAGNOSIS — Z79899 Other long term (current) drug therapy: Secondary | ICD-10-CM | POA: Insufficient documentation

## 2016-02-24 LAB — CBC WITH DIFFERENTIAL/PLATELET
Basophils Absolute: 0.1 10*3/uL (ref 0–0.1)
Basophils Relative: 1 %
Eosinophils Absolute: 0.1 10*3/uL (ref 0–0.7)
Eosinophils Relative: 2 %
HCT: 37.1 % (ref 35.0–47.0)
Hemoglobin: 11.8 g/dL — ABNORMAL LOW (ref 12.0–16.0)
Lymphocytes Relative: 20 %
Lymphs Abs: 1.5 10*3/uL (ref 1.0–3.6)
MCH: 22.2 pg — ABNORMAL LOW (ref 26.0–34.0)
MCHC: 31.8 g/dL — ABNORMAL LOW (ref 32.0–36.0)
MCV: 69.9 fL — ABNORMAL LOW (ref 80.0–100.0)
Monocytes Absolute: 0.4 10*3/uL (ref 0.2–0.9)
Monocytes Relative: 6 %
Neutro Abs: 5.5 10*3/uL (ref 1.4–6.5)
Neutrophils Relative %: 71 %
Platelets: 224 10*3/uL (ref 150–440)
RBC: 5.32 MIL/uL — ABNORMAL HIGH (ref 3.80–5.20)
RDW: 34.3 % — ABNORMAL HIGH (ref 11.5–14.5)
WBC: 7.7 10*3/uL (ref 3.6–11.0)

## 2016-02-24 LAB — URINALYSIS COMPLETE WITH MICROSCOPIC (ARMC ONLY)
Bilirubin Urine: NEGATIVE
Glucose, UA: NEGATIVE mg/dL
Ketones, ur: NEGATIVE mg/dL
Leukocytes, UA: NEGATIVE
Nitrite: NEGATIVE
Protein, ur: >500 mg/dL — AB
Specific Gravity, Urine: 1.01 (ref 1.005–1.030)
pH: 6 (ref 5.0–8.0)

## 2016-02-24 LAB — COMPREHENSIVE METABOLIC PANEL
ALT: 19 U/L (ref 14–54)
AST: 18 U/L (ref 15–41)
Albumin: 4 g/dL (ref 3.5–5.0)
Alkaline Phosphatase: 63 U/L (ref 38–126)
Anion gap: 3 — ABNORMAL LOW (ref 5–15)
BUN: 25 mg/dL — ABNORMAL HIGH (ref 6–20)
CO2: 24 mmol/L (ref 22–32)
Calcium: 8.4 mg/dL — ABNORMAL LOW (ref 8.9–10.3)
Chloride: 108 mmol/L (ref 101–111)
Creatinine, Ser: 1 mg/dL (ref 0.44–1.00)
GFR calc Af Amer: >60 mL/min (ref >60)
GFR calc non Af Amer: >60 mL/min (ref >60)
Glucose, Bld: 87 mg/dL (ref 65–99)
Potassium: 3.8 mmol/L (ref 3.5–5.1)
Sodium: 135 mmol/L (ref 135–145)
Total Bilirubin: 1.4 mg/dL — ABNORMAL HIGH (ref 0.3–1.2)
Total Protein: 6.9 g/dL (ref 6.5–8.1)

## 2016-02-24 LAB — RETICULOCYTES
RBC.: 5.32 MIL/uL — ABNORMAL HIGH (ref 3.80–5.20)
Retic Count, Absolute: 79.8 10*3/uL (ref 19.0–183.0)
Retic Ct Pct: 1.5 % (ref 0.4–3.1)

## 2016-02-24 LAB — DAT, POLYSPECIFIC AHG (ARMC ONLY): Polyspecific AHG test: NEGATIVE

## 2016-02-24 LAB — SAMPLE TO BLOOD BANK

## 2016-02-24 LAB — FERRITIN: Ferritin: 57 ng/mL (ref 11–307)

## 2016-02-24 NOTE — Progress Notes (Signed)
Patient here today for new evaluation after being admitted to the hospital for anemia, patient received blood transfusion in hospital. Patient reports anemia during pregnancy, she received IV iron at that time.

## 2016-02-24 NOTE — Progress Notes (Signed)
[facility] day:  02/24/2016  Chief Complaint: Lindsay Bates is a 50 y.o. female with severe iron deficiency anemia who is referred in consultation by Dr Dustin Flock for assessment and management.  HPI: The patient was admitted to [facility] from 02/12/2016 - 02/13/2016.  She presented with fatigue and shortness of breath on exertion to the [facility].  She also describes severe right sided abdominal pain.    Admission labs were notable for a hematocrit 16.1 with a hemoglobin of 4.6 and an MCV of 52.7.  Ferritin was 1.  Iron was < 5, TIBC was 401, iron saturation could not be calculated.  B12 was 402.  Folate was 14.5.  Bilirubin was 1.3 (1.1 indirect).  Retic was 3.1%.    She was transfused with 4 units of PRBCs.  She received IV iron.  Guaiac cards were negative.  During her admission, she was seen by [facility], gynecologist.  She felt that further investigation for another etiology besides a gynecologic source was warranted.   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  Transvaginal ultrasound on 02/13/2016 revealed a normal uterus and endometrium.  She describes heavy menses since 12/2015.  Menses typically occurs monthly for 3 days. She describes 9 day menses in 12/2015 or  01/2016.  Menses has occurred less than 28 days.  Last menses was on 02/07/2016.  She notes easy bruising with spontaneous bruises.  She denies any aspirin uses.  She takes 2 ibuprofen a month.  She has taken herbal products for 6 months.    She notes no appetite for 4 weeks.  She has lost 9 pounds.  She has gained back some weight.  Regarding her diet, she eats meat 4-5 times a week.  Five days a week, she eats green leafy vegetables.  She has had no recent change in her diet.  She denies any ice pica.  She has been on ferrous sulfate 325 mg  TID since discharge from the hospital.  She comment that she has had anemia in the past.  She was anemic approximately 12 and 15 years ago when pregnant.  She was on oral iron during her pregnancies and after her deliveries for about 1 year.  She did not require a transfusion.  There is no family history of anemia or bleeding except her mother who had heavy menses secondary to fibroids.   Past Medical History  Diagnosis Date  . Anemia     Past Surgical History  Procedure Laterality Date  . No past surgeries      Family History  Problem Relation Age of Onset  . Alcohol abuse Neg Hx   . Arthritis Neg Hx   . Asthma Neg Hx   . Birth defects Neg Hx   . Cancer Neg Hx   . COPD Neg Hx   . Depression Neg Hx   . Diabetes Neg Hx   . Drug abuse Neg Hx   . Early death Neg Hx   . Hearing loss Neg Hx   . Heart disease Neg Hx   . Hyperlipidemia Neg Hx   . Hypertension Neg Hx   . Kidney disease Neg Hx   . Learning disabilities Neg Hx   . Mental illness Neg Hx   . Mental retardation Neg Hx   . Miscarriages / Stillbirths Neg Hx   .  Stroke Neg Hx   . Vision loss Neg Hx   . Varicose Veins Neg Hx     Social History:  reports that she has never smoked. She does not have any smokeless tobacco history on file. She reports that she does not drink alcohol. Her drug history is not on file.  She is from [place].  She moved to the [place] 18 years ago.  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  She has a 46 and 55 year old child.  The patient is accompanied by her boyfriend, Jacqulyn Bath, and the interpreter today.  Allergies: No Known Allergies  Current Medications: Current Outpatient Prescriptions  Medication Sig Dispense Refill  . acetaminophen (TYLENOL) 325 MG tablet Take 650 mg by mouth every 6 (six) hours as needed.    . ferrous sulfate (IRON SUPPLEMENT) 325 (65 FE) MG tablet Take 1 tablet (325 mg total) by mouth 3 (three) times daily with meals. 90 tablet 3  . Multiple Vitamins-Iron  (MULTIVITAMINS WITH IRON) TABS tablet Take 1 tablet by mouth daily. 30 tablet 0  . polyethylene glycol-electrolytes (TRILYTE) 420 g solution Take 4,000 mLs by mouth once. Tome 8 oz cada media hora hasta que su escremento este claro. 4000 mL 0   No current facility-administered medications for this visit.    Review of Systems:  GENERAL:  Feels better since transfusion.  Active.  No fevers or sweats.  Weight loss of 9 pounds (recent improvement). PERFORMANCE STATUS (ECOG):  1 HEENT:  No visual changes, runny nose, sore throat, mouth sores or tenderness. Lungs: No shortness of breath or cough.  No hemoptysis. Cardiac:  No chest pain, palpitations, orthopnea, or PND. GI:  No appetite x 4 weeks.  Right sided abdominal pain.  No nausea, vomiting, diarrhea, constipation, melena or hematochezia. No prior colonoscopy. GU:  Heavy menses 12/2015 - 01/2016.  No urgency, frequency, dysuria, or hematuria. Musculoskeletal:  No back pain.  No joint pain.  No muscle tenderness. Extremities:  No pain or swelling. Skin:  Easy bruising.  No rashes or skin changes. Neuro:  No headache, numbness or weakness, balance or coordination issues. Endocrine:  No diabetes, thyroid issues, hot flashes or night sweats. Psych:  No mood changes, depression or anxiety. Pain:  No focal pain. Review of systems:  All other systems reviewed and found to be negative.  Physical Exam: Blood pressure 155/83, pulse 50, temperature 97.5 F (36.4 C), temperature source Tympanic, resp. rate 20, height '5\' 3"'$  (1.6 m), weight 140 lb 6.9 oz (63.7 kg), last menstrual period 02/07/2016. GENERAL:  Well developed, well nourished, sitting comfortably in the exam room in no acute distress. MENTAL STATUS:  Alert and oriented to person, place and time. HEAD:  Long black hair.  Normocephalic, atraumatic, face symmetric, no Cushingoid features. EYES:  Brown eyes.  Pupils equal round and reactive to light and accomodation.  No conjunctivitis or  scleral icterus. ENT:  Oropharynx clear without lesion.  Dentures.  Tongue normal. Mucous membranes moist.  RESPIRATORY:  Clear to auscultation without rales, wheezes or rhonchi. CARDIOVASCULAR:  Regular rate and rhythm without murmur, rub or gallop. ABDOMEN:  Soft, non-tender, with active bowel sounds, and no hepatosplenomegaly.  No masses. SKIN:  Tan.  No rashes, ulcers or lesions. EXTREMITIES: No edema, no skin discoloration or tenderness.  No palpable cords. LYMPH NODES: No palpable cervical, supraclavicular, axillary or inguinal adenopathy  NEUROLOGICAL: Unremarkable. PSYCH:  Appropriate.  Clinical Support on 02/24/2016  Component Date Value Ref Range Status  . WBC  02/24/2016 7.7  3.6 - 11.0 K/uL Final  . RBC 02/24/2016 5.32* 3.80 - 5.20 MIL/uL Final  . Hemoglobin 02/24/2016 11.8* 12.0 - 16.0 g/dL Final  . HCT 02/24/2016 37.1  35.0 - 47.0 % Final  . MCV 02/24/2016 69.9* 80.0 - 100.0 fL Final  . MCH 02/24/2016 22.2* 26.0 - 34.0 pg Final  . MCHC 02/24/2016 31.8* 32.0 - 36.0 g/dL Final  . RDW 02/24/2016 34.3* 11.5 - 14.5 % Final  . Platelets 02/24/2016 224  150 - 440 K/uL Final  . Neutrophils Relative % 02/24/2016 71   Final  . Neutro Abs 02/24/2016 5.5  1.4 - 6.5 K/uL Final  . Lymphocytes Relative 02/24/2016 20   Final  . Lymphs Abs 02/24/2016 1.5  1.0 - 3.6 K/uL Final  . Monocytes Relative 02/24/2016 6   Final  . Monocytes Absolute 02/24/2016 0.4  0.2 - 0.9 K/uL Final  . Eosinophils Relative 02/24/2016 2   Final  . Eosinophils Absolute 02/24/2016 0.1  0 - 0.7 K/uL Final  . Basophils Relative 02/24/2016 1   Final  . Basophils Absolute 02/24/2016 0.1  0 - 0.1 K/uL Final  . Retic Ct Pct 02/24/2016 1.5  0.4 - 3.1 % Final  . RBC. 02/24/2016 5.32* 3.80 - 5.20 MIL/uL Final  . Retic Count, Manual 02/24/2016 79.8  19.0 - 183.0 K/uL Final  . Sodium 02/24/2016 135  135 - 145 mmol/L Final  . Potassium 02/24/2016 3.8  3.5 - 5.1 mmol/L Final  . Chloride 02/24/2016 108  101 - 111 mmol/L  Final  . CO2 02/24/2016 24  22 - 32 mmol/L Final  . Glucose, Bld 02/24/2016 87  65 - 99 mg/dL Final  . BUN 02/24/2016 25* 6 - 20 mg/dL Final  . Creatinine, Ser 02/24/2016 1.00  0.44 - 1.00 mg/dL Final  . Calcium 02/24/2016 8.4* 8.9 - 10.3 mg/dL Final  . Total Protein 02/24/2016 6.9  6.5 - 8.1 g/dL Final  . Albumin 02/24/2016 4.0  3.5 - 5.0 g/dL Final  . AST 02/24/2016 18  15 - 41 U/L Final  . ALT 02/24/2016 19  14 - 54 U/L Final  . Alkaline Phosphatase 02/24/2016 63  38 - 126 U/L Final  . Total Bilirubin 02/24/2016 1.4* 0.3 - 1.2 mg/dL Final  . GFR calc non Af Amer 02/24/2016 >60  >60 mL/min Final  . GFR calc Af Amer 02/24/2016 >60  >60 mL/min Final   Comment: (NOTE) The eGFR has been calculated using the CKD EPI equation. This calculation has not been validated in all clinical situations. eGFR's persistently <60 mL/min signify possible Chronic Kidney Disease.   . Anion gap 02/24/2016 3* 5 - 15 Final  . Ferritin 02/24/2016 57  11 - 307 ng/mL Final  . Coagulation Factor VIII 02/24/2016 145  57 - 163 % Final  . Ristocetin Co-factor, Plasma 02/24/2016 147  50 - 200 % Final   Comment: (NOTE) Performed At: [facility] [place], [place] [id number] Lindon Romp MD EU:[id number]   . Von Willebrand Antigen, Plasma 02/24/2016 198  50 - 200 % Final  . Blood Bank Specimen 02/24/2016 SAMPLE AVAILABLE FOR TESTING   Final  . Sample Expiration 02/24/2016 02/27/2016   Final  . Polyspecific AHG test 02/24/2016 NEG   Final  . Color, Urine 02/24/2016 YELLOW* YELLOW Final  . APPearance 02/24/2016 CLEAR* CLEAR Final  . Glucose, UA 02/24/2016 NEGATIVE  NEGATIVE mg/dL Final  . Bilirubin Urine 02/24/2016 NEGATIVE  NEGATIVE Final  . Ketones, ur  02/24/2016 NEGATIVE  NEGATIVE mg/dL Final  . Specific Gravity, Urine 02/24/2016 1.010  1.005 - 1.030 Final  . Hgb urine dipstick 02/24/2016 2+* NEGATIVE Final  . pH 02/24/2016 6.0  5.0 - 8.0 Final  . Protein, ur 02/24/2016 >500*  NEGATIVE mg/dL Final  . Nitrite 02/24/2016 NEGATIVE  NEGATIVE Final  . Leukocytes, UA 02/24/2016 NEGATIVE  NEGATIVE Final  . RBC / HPF 02/24/2016 6-30  0 - 5 RBC/hpf Final  . WBC, UA 02/24/2016 0-5  0 - 5 WBC/hpf Final  . Bacteria, UA 02/24/2016 RARE* NONE SEEN Final  . Squamous Epithelial / LPF 02/24/2016 0-5* NONE SEEN Final  . Interpretation 02/24/2016 Note   Final   Comment: (NOTE) ------------------------------- COAGULATION: VON WILLEBRAND FACTOR ASSESSMENT CURRENT RESULTS ASSESSMENT The VWF:Ag is normal. The VWF:RCo is normal. The FVIII is normal. VON WILLEBRAND FACTOR ASSESSMENT CURRENT RESULTS INTERPRETATION - These results are not consistent with a diagnosis of VWD according to the current NHLBI guideline. VON WILLEBRAND FACTOR ASSESSMENT - Results may be falsely elevated and possibly falsely normal as VWF and FVIII may increase in pregnancy, in samples drawn from patients (particularly children) who are visibly stressed at the time of phlebotomy, as acute phase reactants, or in response to certain drug therapies such as desmopressin. Repeat testing may be necessary before excluding a diagnosis of VWD especially if the clinical suspicion is high for an underlying bleeding disorder. The setting for phlebotomy should be as calm as possible and patients should be encouraged to sit quietly prior to the blood draw. VON WILLEBRAND FA                          CTOR ASSESSMENT DEFINITIONS - VWD - von Willebrand disease; VWF - von Villebrand factor; VWF:Ag - VWF antigen; VWF:RCo - VWF ristocetin cofactor activity; FVIII - factor VIII activity. - MEDICAL DIRECTOR: For questions regarding panel interpretation, please contact Anselm Jungling) Nickolas Madrid, M.D. or Jake Bathe, M.D. at [facility] at [phone]. ------------------------------- DISCLAIMER These assessments and interpretations are provided as a convenience in support of the physician-patient  relationship and are not intended to replace the physician's clinical judgment. They are derived from national guidelines in addition to other evidence and expert opinion. The clinician should consider this information within the context of clinical opinion and the individual patient. SEE GUIDANCE FOR VON WILLEBRAND FACTOR ASSESSMENT: (1) The National Heart, Lung and Blood Institute. The Diagnosis, Evaluation and Management of von Willebrand Disease. Janeal Holmes                          , MD: [place] Publication 11-7508. 2585. Available at vSpecials.com.pt. (2) Daryl Eastern et al. Carmin Muskrat J Hematol. 2009; 84(6):366-370. (3) [place]. 2004;10(3):199-217. (4) Pasi KJ et al. Haemophilia. 2004; 10(3):218-231.   Marland Kitchen PDF Image 02/24/2016 .   Final   Comment: (NOTE) Performed At: [facility] [place], [place] [id number] Cassell Smiles PhD TI:[id number]     Assessment:  Lindsay Bates is a 50 y.o. female with severe iron deficiency anemia.  She presented with severe right sided abdominal pain on 02/12/2016.  CBC revealed a hematocrit 16.1, hemoglobin of 4.6, and MCV of 52.7. Ferritin was 1.  Iron was < 5, TIBC was 401, iron saturation could not be calculated.  B12 and folate were normal.  Retic was 3.1%.    She was transfused with 4 units of  PRBCs and IV iron.  Guaiac cards were negative.    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  Transvaginal ultrasound on 02/13/2016 revealed a normal uterus and endometrium.  She has had heavy menses since 12/2015 (last menstrual day 02/07/2016). She notes easy bruising with spontaneous bruises.  She denies any aspirin uses.  She takes 2 ibuprofen a month.  She has taken herbal products for 6 months.    Symptomatically, she has had no  appetite for 4 weeks.  She has lost 9 pounds with recent improvement.  Diet is good.  She eats meat 4-5 times a week (no recent change).  She denies any ice pica.  She has been on ferrous sulfate 325 mg TID.  Plan: 1.  Discuss diagnosis of severe iron deficiency anemia.  Discus treatment with oral iron to replete iron stores (ferritin 100).  Discuss heavy menses and easy bruising.  Discuss testing for von Willebrand's disease.  Discuss avoidance of aspirin and ibuprofen. 2.  Labs today:  CBC with diff, ferritin, von Willebrand panel, retic, Coombs, hold tube. 3.  Urinalysis- r/o hematuria. 4.  Guaiac cards x 3. 5.  Refer to GI at [facility]. 6.  Follow-up abdominal MRI scheduled for 03/04/2016. 7.  Continue ferrous sulfate 325 mg po TID with OJ or vitamin C.  Ferritin goal 100. 8.  Discuss discontinuing herbal products. 9.  RTC in 2 weeks for labs (CBC with diff). 10.  RTC in 4 weeks for MD assessment and labs (CBC with diff, ferritin).   Lequita Asal, MD  02/24/2016, 1:48 PM

## 2016-02-25 LAB — VON WILLEBRAND PANEL
Coagulation Factor VIII: 145 % (ref 57–163)
Ristocetin Co-factor, Plasma: 147 % (ref 50–200)
Von Willebrand Antigen, Plasma: 198 % (ref 50–200)

## 2016-02-25 LAB — COAG STUDIES INTERP REPORT: PDF Image: 0

## 2016-02-27 ENCOUNTER — Ambulatory Visit (INDEPENDENT_AMBULATORY_CARE_PROVIDER_SITE_OTHER): Payer: Self-pay | Admitting: Gastroenterology

## 2016-02-27 ENCOUNTER — Encounter: Payer: Self-pay | Admitting: Gastroenterology

## 2016-02-27 VITALS — BP 148/78 | HR 55 | Temp 98.4°F | Ht 63.0 in | Wt 142.0 lb

## 2016-02-27 DIAGNOSIS — D509 Iron deficiency anemia, unspecified: Secondary | ICD-10-CM

## 2016-02-27 MED ORDER — PEG 3350-KCL-NA BICARB-NACL 420 G PO SOLR: 4000.0000 mL | Freq: Once | ORAL | Status: DC

## 2016-02-28 ENCOUNTER — Telehealth: Payer: Self-pay

## 2016-02-28 ENCOUNTER — Other Ambulatory Visit: Payer: Self-pay

## 2016-02-28 DIAGNOSIS — K869 Disease of pancreas, unspecified: Secondary | ICD-10-CM

## 2016-02-28 NOTE — Progress Notes (Signed)
Gastroenterology Consultation  Referring Provider:     [facility] Primary Care Physician:  [facility] Primary Gastroenterologist:  Dr. Servando SnareWohl     Reason for Consultation:     Anemia        HPI:   Lindsay Bates is a 50 y.o. y/o female referred for consultation & management of anemia by Dr. [facility]. The patient comes in today with a finding of anemia. The patient was found to have an iron less than 5. The patient reports that she has been having heavy rectal bleeding. There is no report of any black stools or bloody stools. The patient denies any unexplained weight loss. The patient denies any family history of colon cancer colon polyps. The patient speaks very little English and the history is obtained through an interpreter. There is no report of any abdominal pain.  Past Medical History  Diagnosis Date  . Anemia     Past Surgical History  Procedure Laterality Date  . No past surgeries      Prior to Admission medications   Medication Sig Start Date End Date Taking? Authorizing Provider  acetaminophen (TYLENOL) 325 MG tablet Take 650 mg by mouth every 6 (six) hours as needed.   Yes Historical Provider, MD  ferrous sulfate (IRON SUPPLEMENT) 325 (65 FE) MG tablet Take 1 tablet (325 mg total) by mouth 3 (three) times daily with meals. 02/13/16  Yes Auburn BilberryShreyang Patel, MD  Multiple Vitamins-Iron (MULTIVITAMINS WITH IRON) TABS tablet Take 1 tablet by mouth daily. 02/13/16  Yes Auburn BilberryShreyang Patel, MD  polyethylene glycol-electrolytes (TRILYTE) 420 g solution Take 4,000 mLs by mouth once. Tome 8 oz cada media hora hasta que su escremento este claro. 02/27/16   Midge Miniumarren Jeana Kersting, MD    Family History  Problem Relation Age of Onset  . Alcohol abuse Neg Hx   . Arthritis Neg Hx   . Asthma Neg Hx   . Birth defects Neg Hx   . Cancer Neg Hx   . COPD Neg Hx   . Depression Neg Hx   . Diabetes Neg Hx   . Drug abuse Neg Hx   . Early death Neg Hx   . Hearing loss Neg  Hx   . Heart disease Neg Hx   . Hyperlipidemia Neg Hx   . Hypertension Neg Hx   . Kidney disease Neg Hx   . Learning disabilities Neg Hx   . Mental illness Neg Hx   . Mental retardation Neg Hx   . Miscarriages / Stillbirths Neg Hx   . Stroke Neg Hx   . Vision loss Neg Hx   . Varicose Veins Neg Hx      Social History  Substance Use Topics  . Smoking status: Never Smoker   . Smokeless tobacco: None  . Alcohol Use: No    Allergies as of 02/27/2016  . (No Known Allergies)    Review of Systems:    All systems reviewed and negative except where noted in HPI.   Physical Exam:  BP 148/78 mmHg  Pulse 55  Temp(Src) 98.4 F (36.9 C) (Oral)  Ht 5\' 3"  (1.6 m)  Wt 142 lb (64.411 kg)  BMI 25.16 kg/m2  LMP 02/07/2016 (Exact Date) Patient's last menstrual period was 02/07/2016 (exact date). Psych:  Alert and cooperative. Normal mood and affect. General:   Alert,  Well-developed, well-nourished, pleasant and cooperative in NAD Head:  Normocephalic and atraumatic. Eyes:  Sclera clear, no icterus.   Conjunctiva  pink. Ears:  Normal auditory acuity. Nose:  No deformity, discharge, or lesions. Mouth:  No deformity or lesions,oropharynx pink & moist. Neck:  Supple; no masses or thyromegaly. Lungs:  Respirations even and unlabored.  Clear throughout to auscultation.   No wheezes, crackles, or rhonchi. No acute distress. Heart:  Regular rate and rhythm; no murmurs, clicks, rubs, or gallops. Abdomen:  Normal bowel sounds.  No bruits.  Soft, non-tender and non-distended without masses, hepatosplenomegaly or hernias noted.  No guarding or rebound tenderness.  Negative Carnett sign.   Rectal:  Deferred.  Msk:  Symmetrical without gross deformities.  Good, equal movement & strength bilaterally. Pulses:  Normal pulses noted. Extremities:  No clubbing or edema.  No cyanosis. Neurologic:  Alert and oriented x3;  grossly normal neurologically. Skin:  Intact without significant lesions or rashes.   No jaundice. Lymph Nodes:  No significant cervical adenopathy. Psych:  Alert and cooperative. Normal mood and affect.  Imaging Studies: US Transvaginal Non-ob  02-25-2016  CLINICAL DATA:  Heavy menses for 1 month. Gravida 4 para 3. LMP 02/07/2016. EXAM: TRANSABDOMINAL AND TRANSVAGINAL ULTRASOUND OF PELVIS TECHNIQUE: Both transabdominal and transvaginal ultrasound examinations of the pelvis were performed. Transabdominal technique was performed for global imaging of the pelvis including uterus, ovaries, adnexal regions, and pelvic cul-de-sac. It was necessary to proceed with endovaginal exam following the transabdominal exam to visualize the endometrium, ovaries. COMPARISON:  None FINDINGS: Uterus Measurements: 10.0 x 5.3 x 6.9 cm. No fibroids or other mass visualized. Endometrium Thickness: 4.0 mm.  No focal abnormality visualized. Right ovary Measurements: 2.5 x 2.5 x 2.3 cm. Normal appearance/no adnexal mass. Dominant follicle is 1.8 cm. Left ovary Measurements: 2.7 x 1.6 x 1.9 cm. Normal appearance/no adnexal mass. Other findings Small amount of free pelvic fluid is present. IMPRESSION: 1. Normal appearance of the uterus and endometrium. 2. Normal appearance of the endometrial stripe. If bleeding remains unresponsive to hormonal or medical therapy, sonohysterogram should be considered for focal lesion work-up. (Ref: Radiological Reasoning: Algorithmic Workup of Abnormal Vaginal Bleeding with Endovaginal Sonography and Sonohysterography. AJR 2008; 696:E95-28) 3. Small amount of free pelvic fluid likely physiologic. Electronically Signed   By: Norva Pavlov M.D.   On: 02/25/2016 12:09   US Pelvis Complete  February 25, 2016  CLINICAL DATA:  Heavy menses for 1 month. Gravida 4 para 3. LMP 02/07/2016. EXAM: TRANSABDOMINAL AND TRANSVAGINAL ULTRASOUND OF PELVIS TECHNIQUE: Both transabdominal and transvaginal ultrasound examinations of the pelvis were performed. Transabdominal technique was performed for global  imaging of the pelvis including uterus, ovaries, adnexal regions, and pelvic cul-de-sac. It was necessary to proceed with endovaginal exam following the transabdominal exam to visualize the endometrium, ovaries. COMPARISON:  None FINDINGS: Uterus Measurements: 10.0 x 5.3 x 6.9 cm. No fibroids or other mass visualized. Endometrium Thickness: 4.0 mm.  No focal abnormality visualized. Right ovary Measurements: 2.5 x 2.5 x 2.3 cm. Normal appearance/no adnexal mass. Dominant follicle is 1.8 cm. Left ovary Measurements: 2.7 x 1.6 x 1.9 cm. Normal appearance/no adnexal mass. Other findings Small amount of free pelvic fluid is present. IMPRESSION: 1. Normal appearance of the uterus and endometrium. 2. Normal appearance of the endometrial stripe. If bleeding remains unresponsive to hormonal or medical therapy, sonohysterogram should be considered for focal lesion work-up. (Ref: Radiological Reasoning: Algorithmic Workup of Abnormal Vaginal Bleeding with Endovaginal Sonography and Sonohysterography. AJR 2008; 413:K44-01) 3. Small amount of free pelvic fluid likely physiologic. Electronically Signed   By: Norva Pavlov M.D.   On: 02-25-16 12:09  US Abdomen Limited Ruq  02/12/2016  CLINICAL DATA:  Abdominal pain for 3 weeks, no nausea or vomiting EXAM: US ABDOMEN LIMITED - RIGHT UPPER QUADRANT COMPARISON:  None FINDINGS: Gallbladder: Normally distended without stones or wall thickening. No pericholecystic fluid or sonographic Murphy sign. Common bile duct: Diameter: Normal caliber 2 mm diameter Liver: Normal echogenicity without mass.  Hepatopetal portal venous flow. No RIGHT upper quadrant free fluid. Question cystic lesion at pancreatic head, septated, 2.2 x 1.6 x 1.3 cm. IMPRESSION: No hepatobiliary abnormalities identified. 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 Electronically Signed   By: Ulyses Southward  M.D.   On: 02/12/2016 11:11    Assessment and Plan:   Lindsay Bates is a 50 y.o. y/o female who comes in today with a history of anemia. The patient does report that she has very heavy periods that last anywhere from 9 to11 days. The patient will be set up for an EGD and colonoscopy due to her iron deficiency anemia to rule out a GI source of her anemia. The patient did have a ultrasound of her abdomen for abdominal pain which showed a septated cystic lesion in the pancreatic head and MRI was recommended. The patient denies any abdominal pain at the present time. The patient has been explained the plan and agrees with it.   Note: This dictation was prepared with Dragon dictation along with smaller phrase technology. Any transcriptional errors that result from this process are unintentional.

## 2016-02-28 NOTE — Telephone Encounter (Signed)
Called patient to let her know that her MRI was scheduled for 03/04/2016 at 10:30 AM at the [facility]. Patient was told to be NPO 4 hours prior to her test. Patient understood.

## 2016-03-02 ENCOUNTER — Encounter: Payer: Self-pay | Admitting: Hematology and Oncology

## 2016-03-02 DIAGNOSIS — N920 Excessive and frequent menstruation with regular cycle: Secondary | ICD-10-CM | POA: Insufficient documentation

## 2016-03-02 DIAGNOSIS — R319 Hematuria, unspecified: Secondary | ICD-10-CM | POA: Insufficient documentation

## 2016-03-02 DIAGNOSIS — K863 Pseudocyst of pancreas: Secondary | ICD-10-CM | POA: Insufficient documentation

## 2016-03-02 DIAGNOSIS — K449 Diaphragmatic hernia without obstruction or gangrene: Secondary | ICD-10-CM | POA: Insufficient documentation

## 2016-03-02 DIAGNOSIS — R634 Abnormal weight loss: Secondary | ICD-10-CM | POA: Insufficient documentation

## 2016-03-02 DIAGNOSIS — K648 Other hemorrhoids: Secondary | ICD-10-CM | POA: Insufficient documentation

## 2016-03-02 DIAGNOSIS — R42 Dizziness and giddiness: Secondary | ICD-10-CM | POA: Insufficient documentation

## 2016-03-02 DIAGNOSIS — D509 Iron deficiency anemia, unspecified: Secondary | ICD-10-CM | POA: Insufficient documentation

## 2016-03-02 DIAGNOSIS — R630 Anorexia: Secondary | ICD-10-CM | POA: Insufficient documentation

## 2016-03-02 DIAGNOSIS — Z79899 Other long term (current) drug therapy: Secondary | ICD-10-CM | POA: Insufficient documentation

## 2016-03-04 ENCOUNTER — Ambulatory Visit
Admission: RE | Admit: 2016-03-04 | Discharge: 2016-03-04 | Disposition: A | Discharge status: Home / Self Care | Payer: Self-pay | Source: Ambulatory Visit | Attending: Gastroenterology | Admitting: Gastroenterology

## 2016-03-04 DIAGNOSIS — K869 Disease of pancreas, unspecified: Secondary | ICD-10-CM | POA: Insufficient documentation

## 2016-03-04 MED ORDER — GADOBENATE DIMEGLUMINE 529 MG/ML IV SOLN
15.0000 mL | Freq: Once | INTRAVENOUS | Status: AC | PRN
  Administered 2016-03-04: 14 mL via INTRAVENOUS

## 2016-03-09 ENCOUNTER — Inpatient Hospital Stay: Payer: Self-pay | Attending: Hematology and Oncology

## 2016-03-09 DIAGNOSIS — D509 Iron deficiency anemia, unspecified: Secondary | ICD-10-CM

## 2016-03-09 LAB — CBC WITH DIFFERENTIAL/PLATELET
Basophils Absolute: 0.1 10*3/uL (ref 0–0.1)
Basophils Relative: 1 %
Eosinophils Absolute: 0.2 10*3/uL (ref 0–0.7)
Eosinophils Relative: 3 %
HCT: 36.2 % (ref 35.0–47.0)
Hemoglobin: 11.9 g/dL — ABNORMAL LOW (ref 12.0–16.0)
Lymphocytes Relative: 23 %
Lymphs Abs: 1.7 10*3/uL (ref 1.0–3.6)
MCH: 23.8 pg — ABNORMAL LOW (ref 26.0–34.0)
MCHC: 32.9 g/dL (ref 32.0–36.0)
MCV: 72.3 fL — ABNORMAL LOW (ref 80.0–100.0)
Monocytes Absolute: 0.4 10*3/uL (ref 0.2–0.9)
Monocytes Relative: 5 %
Neutro Abs: 5.1 10*3/uL (ref 1.4–6.5)
Neutrophils Relative %: 68 %
Platelets: 241 10*3/uL (ref 150–440)
RBC: 5.01 MIL/uL (ref 3.80–5.20)
RDW: 31.5 % — ABNORMAL HIGH (ref 11.5–14.5)
WBC: 7.5 10*3/uL (ref 3.6–11.0)

## 2016-03-09 LAB — OCCULT BLOOD X 1 CARD TO LAB, STOOL
Fecal Occult Bld: NEGATIVE
Fecal Occult Bld: NEGATIVE
Fecal Occult Bld: NEGATIVE

## 2016-03-10 ENCOUNTER — Encounter: Payer: Self-pay | Admitting: *Deleted

## 2016-03-12 ENCOUNTER — Telehealth: Payer: Self-pay

## 2016-03-12 NOTE — Discharge Instructions (Signed)
Anestesia general, adultos, cuidados posteriores °(General Anesthesia, Adult, Care After) °Siga estas instrucciones durante las próximas semanas. Estas indicaciones le proporcionan información acerca de cómo deberá cuidarse después del procedimiento. El médico también podrá darle instrucciones más específicas. El tratamiento se ha planificado de acuerdo a las prácticas médicas actuales, pero a veces se producen problemas. Comuníquese con el médico si tiene algún problema o tiene dudas después del procedimiento. °QUÉ ESPERAR DESPUÉS DEL PROCEDIMIENTO °Después del procedimiento es habitual experimentar: °· Somnolencia. °· Náuseas y vómitos. °INSTRUCCIONES PARA EL CUIDADO EN EL HOGAR °· Durante las primeras 24 horas luego de la anestesia general: °¨ Haga que una persona responsable se quede con usted. °¨ No conduzca un automóvil. Si está solo, no viaje en transporte público. °¨ No beba alcohol. °¨ No tome medicamentos que no le haya recetado su médico. °¨ No firme documentos importantes ni tome decisiones trascendentes. °¨ Puede reanudar su dieta y sus actividades normales según le haya indicado el médico. °· Cambie los vendajes (apósitos) tal como se le indicó. °· Si tiene preguntas o se le presenta algún problema relacionado con la anestesia general, comuníquese con el hospital y pida por el anestesista o anestesiólogo de guardia. °SOLICITE ATENCIÓN MÉDICA SI: °· Tiene náuseas y vómitos durante el día posterior a la anestesia. °· Le aparece una erupción cutánea. °SOLICITE ATENCIÓN MÉDICA DE INMEDIATO SI:  °· Tiene dificultad para respirar. °· Siente dolor en el pecho. °· Tiene algún problema alérgico. °  °Esta información no tiene como fin reemplazar el consejo del médico. Asegúrese de hacerle al médico cualquier pregunta que tenga. °  °Document Released: 11/16/2005 Document Revised: 12/07/2014 °Elsevier Interactive Patient Education ©2016 Elsevier Inc. ° °

## 2016-03-12 NOTE — Telephone Encounter (Signed)
-----   Message from Midge Miniumarren Wohl, MD sent at 03/05/2016 11:56 AM EDT ----- Please set patient up for an EUS.

## 2016-03-12 NOTE — Telephone Encounter (Addendum)
Lindsay Bates, you had helped me with this pt a couple of weeks ago and Dr. Servando SnareWohl had ordered a MRI. 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 I have already spoken with Percell BostonChristy Stanton about a date. We have booked her for May 25th. [name] will call you Monday so she can give you more information to relay to her when you call. Thank you again for helping me.

## 2016-03-16 ENCOUNTER — Telehealth: Payer: Self-pay

## 2016-03-16 ENCOUNTER — Ambulatory Visit: Payer: Self-pay | Admitting: Student in an Organized Health Care Education/Training Program

## 2016-03-16 ENCOUNTER — Encounter
Admission: RE | Disposition: A | Discharge status: Home / Self Care | Payer: Self-pay | Source: Ambulatory Visit | Attending: Gastroenterology

## 2016-03-16 ENCOUNTER — Ambulatory Visit
Admission: RE | Admit: 2016-03-16 | Discharge: 2016-03-16 | Disposition: A | Discharge status: Home / Self Care | Payer: Self-pay | Source: Ambulatory Visit | Attending: Gastroenterology | Admitting: Gastroenterology

## 2016-03-16 DIAGNOSIS — D509 Iron deficiency anemia, unspecified: Secondary | ICD-10-CM | POA: Insufficient documentation

## 2016-03-16 DIAGNOSIS — K449 Diaphragmatic hernia without obstruction or gangrene: Secondary | ICD-10-CM | POA: Insufficient documentation

## 2016-03-16 DIAGNOSIS — K641 Second degree hemorrhoids: Secondary | ICD-10-CM | POA: Insufficient documentation

## 2016-03-16 DIAGNOSIS — Z79899 Other long term (current) drug therapy: Secondary | ICD-10-CM | POA: Insufficient documentation

## 2016-03-16 HISTORY — PX: ESOPHAGOGASTRODUODENOSCOPY (EGD) WITH PROPOFOL: SHX5813

## 2016-03-16 HISTORY — DX: Presence of dental prosthetic device (complete) (partial): Z97.2

## 2016-03-16 HISTORY — DX: Personal history of other medical treatment: Z92.89

## 2016-03-16 HISTORY — PX: COLONOSCOPY WITH PROPOFOL: SHX5780

## 2016-03-16 SURGERY — COLONOSCOPY WITH PROPOFOL
Anesthesia: Monitor Anesthesia Care | Wound class: Contaminated

## 2016-03-16 MED ORDER — SODIUM CHLORIDE 0.9 % IV SOLN: INTRAVENOUS | Status: DC

## 2016-03-16 MED ORDER — FENTANYL CITRATE (PF) 100 MCG/2ML IJ SOLN: 25.0000 ug | INTRAMUSCULAR | Status: DC | PRN

## 2016-03-16 MED ORDER — PROPOFOL 10 MG/ML IV BOLUS
INTRAVENOUS | Status: DC | PRN
  Administered 2016-03-16: 40 mg via INTRAVENOUS
  Administered 2016-03-16: 100 mg via INTRAVENOUS
  Administered 2016-03-16: 40 mg via INTRAVENOUS
  Administered 2016-03-16: 60 mg via INTRAVENOUS
  Administered 2016-03-16: 40 mg via INTRAVENOUS

## 2016-03-16 MED ORDER — LACTATED RINGERS IV SOLN
INTRAVENOUS | Status: DC
  Administered 2016-03-16: 08:00:00 via INTRAVENOUS

## 2016-03-16 MED ORDER — LIDOCAINE HCL (CARDIAC) 20 MG/ML IV SOLN
INTRAVENOUS | Status: DC | PRN
Start: 2016-03-16 — End: 2016-03-16
  Administered 2016-03-16: 20 mg via INTRAVENOUS

## 2016-03-16 MED ORDER — SIMETHICONE 40 MG/0.6ML PO SUSP
ORAL | Status: DC | PRN
  Administered 2016-03-16: 09:00:00

## 2016-03-16 MED ORDER — GLYCOPYRROLATE 0.2 MG/ML IJ SOLN
INTRAMUSCULAR | Status: DC | PRN
  Administered 2016-03-16: 0.1 mg via INTRAVENOUS

## 2016-03-16 SURGICAL SUPPLY — 32 items
BALLN DILATOR 10-12 8 (BALLOONS)
BALLN DILATOR 12-15 8 (BALLOONS)
BALLN DILATOR 15-18 8 (BALLOONS)
BALLN DILATOR CRE 0-12 8 (BALLOONS)
BALLN DILATOR ESOPH 8 10 CRE (MISCELLANEOUS) IMPLANT
BALLOON DILATOR 12-15 8 (BALLOONS) IMPLANT
BALLOON DILATOR 15-18 8 (BALLOONS) IMPLANT
BALLOON DILATOR CRE 0-12 8 (BALLOONS) IMPLANT
BLOCK BITE 60FR ADLT L/F GRN (MISCELLANEOUS) ×3 IMPLANT
CANISTER SUCT 1200ML W/VALVE (MISCELLANEOUS) ×3 IMPLANT
CLIP HMST 235XBRD CATH ROT (MISCELLANEOUS) IMPLANT
CLIP RESOLUTION 360 11X235 (MISCELLANEOUS)
FCP ESCP3.2XJMB 240X2.8X (MISCELLANEOUS)
FORCEPS BIOP RAD 4 LRG CAP 4 (CUTTING FORCEPS) IMPLANT
FORCEPS BIOP RJ4 240 W/NDL (MISCELLANEOUS)
FORCEPS ESCP3.2XJMB 240X2.8X (MISCELLANEOUS) IMPLANT
GOWN CVR UNV OPN BCK APRN NK (MISCELLANEOUS) ×2 IMPLANT
GOWN ISOL THUMB LOOP REG UNIV (MISCELLANEOUS) ×4
INJECTOR VARIJECT VIN23 (MISCELLANEOUS) IMPLANT
KIT DEFENDO VALVE AND CONN (KITS) IMPLANT
KIT ENDO PROCEDURE OLY (KITS) ×3 IMPLANT
MARKER SPOT ENDO TATTOO 5ML (MISCELLANEOUS) IMPLANT
PAD GROUND ADULT SPLIT (MISCELLANEOUS) IMPLANT
PROBE APC STR FIRE (PROBE) ×3 IMPLANT
SNARE SHORT THROW 13M SML OVAL (MISCELLANEOUS) IMPLANT
SNARE SHORT THROW 30M LRG OVAL (MISCELLANEOUS) IMPLANT
SPOT EX ENDOSCOPIC TATTOO (MISCELLANEOUS)
SYR INFLATION 60ML (SYRINGE) IMPLANT
VARIJECT INJECTOR VIN23 (MISCELLANEOUS)
WATER STERILE IRR 250ML POUR (IV SOLUTION) ×3 IMPLANT
WIDE-EYE POLYPTRAP (MISCELLANEOUS) IMPLANT
WIRE CRE 18-20MM 8CM F G (MISCELLANEOUS) IMPLANT

## 2016-03-16 NOTE — Transfer of Care (Signed)
Immediate Anesthesia Transfer of Care Note  Patient: Lindsay Bates  Procedure(s) Performed: Procedure(s) with comments: COLONOSCOPY WITH PROPOFOL (N/A) ESOPHAGOGASTRODUODENOSCOPY (EGD) WITH PROPOFOL (N/A) - Needs interpreter  Patient Location: PACU  Anesthesia Type: MAC  Level of Consciousness: awake, alert  and patient cooperative  Airway and Oxygen Therapy: Patient Spontanous Breathing and Patient connected to supplemental oxygen  Post-op Assessment: Post-op Vital signs reviewed, Patient's Cardiovascular Status Stable, Respiratory Function Stable, Patent Airway and No signs of Nausea or vomiting  Post-op Vital Signs: Reviewed and stable  Complications: No apparent anesthesia complications

## 2016-03-16 NOTE — Anesthesia Preprocedure Evaluation (Signed)
Anesthesia Evaluation  Patient identified by MRN, date of birth, ID band Patient awake    Reviewed: Allergy & Precautions, NPO status , Patient's Chart, lab work & pertinent test results  Airway Mallampati: II  TM Distance: >3 FB Neck ROM: Full    Dental no notable dental hx.    Pulmonary neg pulmonary ROS,    Pulmonary exam normal breath sounds clear to auscultation       Cardiovascular negative cardio ROS Normal cardiovascular exam Rhythm:Regular Rate:Normal     Neuro/Psych negative neurological ROS  negative psych ROS   GI/Hepatic negative GI ROS, Neg liver ROS,   Endo/Other  negative endocrine ROS  Renal/GU negative Renal ROS  negative genitourinary   Musculoskeletal negative musculoskeletal ROS (+)   Abdominal   Peds negative pediatric ROS (+)  Hematology  (+) anemia , History of transfusion   Anesthesia Other Findings   Reproductive/Obstetrics negative OB ROS                             Anesthesia Physical Anesthesia Plan  ASA: II  Anesthesia Plan: MAC   Post-op Pain Management:    Induction: Intravenous  Airway Management Planned:   Additional Equipment:   Intra-op Plan:   Post-operative Plan: Extubation in OR  Informed Consent: I have reviewed the patients History and Physical, chart, labs and discussed the procedure including the risks, benefits and alternatives for the proposed anesthesia with the patient or authorized representative who has indicated his/her understanding and acceptance.   Dental advisory given  Plan Discussed with: CRNA  Anesthesia Plan Comments:         Anesthesia Quick Evaluation

## 2016-03-16 NOTE — Telephone Encounter (Signed)
  Oncology Nurse Navigator Documentation  Navigator Location: [facility] (03/16/16 1100) Navigator Encounter Type: Telephone (03/16/16 1100) Telephone: Outgoing Call (03/16/16 1100) Abnormal Finding Date: 03/04/16 (03/16/16 1100)           Barriers/Navigation Needs: Coordination of Care (Language barrier) (03/16/16 1100)   Interventions: Coordination of Care (03/16/16 1100)   Coordination of Care: EUS (03/16/16 1100)        Acuity: Level 2 (03/16/16 1100)   Acuity Level 2: Initial guidance, education and coordination as needed;Educational needs;Assistance expediting appointments;Ongoing guidance and education throughout treatment as needed (03/16/16 1100)     Time Spent with Patient: 30 (03/16/16 1100)   Referral received for EUS for cystic lesion of pancreatic head. Scheduled for 04-23-16 with [facility]. Patient is spanish speaking only. [facility] interpreter services to go over with patient. Provided my contact information for any future questions or concerns. Can also send copy of instructions to patient.  INSTRUCTIONS FOR ENDOSCOPIC ULTRASOUND -Your procedure has been scheduled for May 25th with [facility] -The hospital will contact you to pre-register over the phone. If for any reason you have not received a call within one week prior to your scheduled procedure date, please call [phone]. -To get your scheduled arrival time, please call the Endoscopy unit at  [phone] between 1-3pm on: May 24th     -ON THE DAY OF YOU PROCEDURE:   1. If you are scheduled for a morning procedure, nothing to drink after midnight  -If you are scheduled for an afternoon procedure, you may have clear liquids until 5 hours prior  to the procedure but no carbonated drinks or broth  2. NO FOOD THE DAY OF YOUR PROCEDURE  3. You may take your heart, seizure, blood pressure, Parkinson's or breathing medications at  6am with just  enough water to get your pills down  4. Do not take any oral Diabetic medications the morning of your procedure.  5. If you are a diabetic and are using insulin, please notify your prescribing physician of this  procedure as your dose may need to be altered related to not being able to eat or drink.   5. Do not take Vitamins     -On the day of your procedure, come to the [facility] Admitting/Registration desk (First desk on the right) at the scheduled arrival time. You MUST have someone drive you home from your procedure. You must have a responsible adult with a valid drivers license who is on site throughout your entire procedure and who can stay with you for several hours after your procedure. You may not go home alone in a taxi, shuttle [place] or bus, as the drivers will not be responsible for you.  --If you have any questions please call me at the above contact

## 2016-03-16 NOTE — Op Note (Signed)
[facility] Gastroenterology Patient Name: Lindsay Bates Procedure Date: 03/16/2016 8:18 AM MRN: [id number] Account #: [id number] Date of Birth: 06/03/1966 Admit Type: Outpatient Age: 50 Room: [facility] OR ROOM 01 Gender: Female Note Status: Finalized Procedure:            Colonoscopy Indications:          Iron deficiency anemia Providers:            Midge Minium, MD Medicines:            Propofol per Anesthesia Complications:        No immediate complications. Procedure:            Pre-Anesthesia Assessment:                       - Prior to the procedure, a History and Physical was                        performed, and patient medications and allergies were                        reviewed. The patient's tolerance of previous                        anesthesia was also reviewed. The risks and benefits of                        the procedure and the sedation options and risks were                        discussed with the patient. All questions were                        answered, and informed consent was obtained. Prior                        Anticoagulants: The patient has taken no previous                        anticoagulant or antiplatelet agents. ASA Grade                        Assessment: II - A patient with mild systemic disease.                        After reviewing the risks and benefits, the patient was                        deemed in satisfactory condition to undergo the                        procedure.                       After obtaining informed consent, the colonoscope was                        passed under direct vision. Throughout the procedure,                        the patient's blood pressure, pulse, and oxygen  saturations were monitored continuously. The was                        introduced through the anus and advanced to the the                        cecum, identified by appendiceal orifice and ileocecal                valve. The colonoscopy was performed without                        difficulty. The patient tolerated the procedure well.                        The quality of the bowel preparation was fair. Findings:      The perianal and digital rectal examinations were normal.      Non-bleeding internal hemorrhoids were found during retroflexion. The       hemorrhoids were Grade II (internal hemorrhoids that prolapse but reduce       spontaneously). Impression:           - Preparation of the colon was fair.                       - Non-bleeding internal hemorrhoids.                       - No specimens collected. Recommendation:       - Follow Hb. Procedure Code(s):    --- Professional ---                       [phone], Colonoscopy, flexible; diagnostic, including                        collection of specimen(s) by brushing or washing, when                        performed (separate procedure) Diagnosis Code(s):    --- Professional ---                       D50.9, Iron deficiency anemia, unspecified CPT copyright 2016 American Medical Association. All rights reserved. The codes documented in this report are preliminary and upon coder review may  be revised to meet current compliance requirements. Midge Miniumarren Jermisha Hoffart, MD 03/16/2016 8:48:35 AM This report has been signed electronically. Number of Addenda: 0 Note Initiated On: 03/16/2016 8:18 AM Scope Withdrawal Time: 0 hours 6 minutes 26 seconds  Total Procedure Duration: 0 hours 8 minutes 32 seconds       [facility]

## 2016-03-16 NOTE — Op Note (Signed)
[facility] Gastroenterology Patient Name: Lindsay Bates Procedure Date: 03/16/2016 8:18 AM MRN: [id number] Account #: [id number] Date of Birth: [birth year] Admit Type: Outpatient Age: 50 Room: [facility] OR ROOM 01 Gender: Female Note Status: Finalized Procedure:            Upper GI endoscopy Indications:          Iron deficiency anemia Providers:            Midge Minium, MD Referring MD:         Dr Leonette Most drew [facility], MD (Referring MD) Medicines:            Propofol per Anesthesia Complications:        No immediate complications. Procedure:            Pre-Anesthesia Assessment:                       - Prior to the procedure, a History and Physical was                        performed, and patient medications and allergies were                        reviewed. The patient's tolerance of previous                        anesthesia was also reviewed. The risks and benefits of                        the procedure and the sedation options and risks were                        discussed with the patient. All questions were                        answered, and informed consent was obtained. Prior                        Anticoagulants: The patient has taken no previous                        anticoagulant or antiplatelet agents. ASA Grade                        Assessment: II - A patient with mild systemic disease.                        After reviewing the risks and benefits, the patient was                        deemed in satisfactory condition to undergo the                        procedure.                       - Prior to the procedure, a History and Physical was                        performed, and patient medications and allergies were  reviewed. The patient's tolerance of previous                        anesthesia was also reviewed. The risks and benefits of                        the procedure and the sedation options and risks were               discussed with the patient. All questions were                        answered, and informed consent was obtained. Prior                        Anticoagulants: The patient has taken no previous                        anticoagulant or antiplatelet agents. ASA Grade                        Assessment: II - A patient with mild systemic disease.                        After reviewing the risks and benefits, the patient was                        deemed in satisfactory condition to undergo the                        procedure.                       After obtaining informed consent, the endoscope was                        passed under direct vision. Throughout the procedure,                        the patient's blood pressure, pulse, and oxygen                        saturations were monitored continuously. The Olympus                        GIF-HQ190 Endoscope (S#. [id number]) was introduced                        through the mouth, and advanced to the second part of                        duodenum. The upper GI endoscopy was accomplished                        without difficulty. The patient tolerated the procedure                        well. Findings:      A small hiatal hernia was present.      The stomach was normal.      The examined duodenum was normal. Impression:           -  Small hiatal hernia.                       - Normal stomach.                       - Normal examined duodenum.                       - No specimens collected. Recommendation:       - Perform a colonoscopy today. Procedure Code(s):    --- Professional ---                       [phone], Esophagogastroduodenoscopy, flexible, transoral;                        diagnostic, including collection of specimen(s) by                        brushing or washing, when performed (separate procedure) Diagnosis Code(s):    --- Professional ---                       D50.9, Iron deficiency anemia, unspecified                        K44.9, Diaphragmatic hernia without obstruction or                        gangrene CPT copyright 2016 American Medical Association. All rights reserved. The codes documented in this report are preliminary and upon coder review may  be revised to meet current compliance requirements. Midge Minium, MD 03/16/2016 8:35:14 AM This report has been signed electronically. Number of Addenda: 0 Note Initiated On: 03/16/2016 8:18 AM      [facility]

## 2016-03-16 NOTE — H&P (Signed)
[facility]  [address] [place], [place] [address] Phone: [phone] Fax : [phone]  Primary Care Physician:  [facility] Gastroenterologist:  Dr. Servando SnareWohl  Pre-Procedure History & Physical: HPI:  Lindsay Bates is a 50 y.o. female is here for an endoscopy and colonoscopy.   Past Medical History  Diagnosis Date  . Anemia   . Wears dentures     full upper and lower   . Transfusion history 02/12/16    2 units    Past Surgical History  Procedure Laterality Date  . Tubal ligation      Prior to Admission medications   Medication Sig Start Date End Date Taking? Authorizing Provider  acetaminophen (TYLENOL) 325 MG tablet Take 650 mg by mouth every 6 (six) hours as needed.   Yes Historical Provider, MD  ferrous sulfate (IRON SUPPLEMENT) 325 (65 FE) MG tablet Take 1 tablet (325 mg total) by mouth 3 (three) times daily with meals. 02/13/16  Yes Auburn BilberryShreyang Patel, MD  Multiple Vitamins-Iron (MULTIVITAMINS WITH IRON) TABS tablet Take 1 tablet by mouth daily. 02/13/16  Yes Auburn BilberryShreyang Patel, MD  polyethylene glycol-electrolytes (TRILYTE) 420 g solution Take 4,000 mLs by mouth once. Tome 8 oz cada media hora hasta que su escremento este claro. 02/27/16  Yes Midge Miniumarren Shamekia Tippets, MD    Allergies as of 02/28/2016  . (No Known Allergies)    Family History  Problem Relation Age of Onset  . Alcohol abuse Neg Hx   . Arthritis Neg Hx   . Asthma Neg Hx   . Birth defects Neg Hx   . Cancer Neg Hx   . COPD Neg Hx   . Depression Neg Hx   . Diabetes Neg Hx   . Drug abuse Neg Hx   . Early death Neg Hx   . Hearing loss Neg Hx   . Heart disease Neg Hx   . Hyperlipidemia Neg Hx   . Hypertension Neg Hx   . Kidney disease Neg Hx   . Learning disabilities Neg Hx   . Mental illness Neg Hx   . Mental retardation Neg Hx   . Miscarriages / Stillbirths Neg Hx   . Stroke Neg Hx   . Vision loss Neg Hx   . Varicose Veins Neg Hx     Social History   Social History    . Marital Status: Single    Spouse Name: N/A  . Number of Children: N/A  . Years of Education: N/A   Occupational History  . Not on file.   Social History Main Topics  . Smoking status: Never Smoker   . Smokeless tobacco: Not on file  . Alcohol Use: No  . Drug Use: Not on file  . Sexual Activity: Not on file   Other Topics Concern  . Not on file   Social History Narrative    Review of Systems: See HPI, otherwise negative ROS  Physical Exam: Ht 5\' 3"  (1.6 m)  Wt 142 lb (64.411 kg)  BMI 25.16 kg/m2  LMP 03/04/2016 General:   Alert,  pleasant and cooperative in NAD Head:  Normocephalic and atraumatic. Neck:  Supple; no masses or thyromegaly. Lungs:  Clear throughout to auscultation.    Heart:  Regular rate and rhythm. Abdomen:  Soft, nontender and nondistended. Normal bowel sounds, without guarding, and without rebound.   Neurologic:  Alert and  oriented x4;  grossly normal neurologically.  Impression/Plan: Lindsay Bates is here for an endoscopy and colonoscopy to be performed  for IDA  Risks, benefits, limitations, and alternatives regarding  endoscopy and colonoscopy have been reviewed with the patient.  Questions have been answered.  All parties agreeable.   Darlina Rumpf, MD  03/16/2016, 7:54 AM

## 2016-03-16 NOTE — Anesthesia Procedure Notes (Signed)
Procedure Name: MAC Performed by: Yaniyah Koors Pre-anesthesia Checklist: Patient identified, Emergency Drugs available, Suction available, Patient being monitored and Timeout performed Patient Re-evaluated:Patient Re-evaluated prior to inductionOxygen Delivery Method: Nasal cannula       

## 2016-03-16 NOTE — Telephone Encounter (Signed)
Called patient to let her know that her appointment for the E[facility] will [facility]. I told patient that Instructions that were provided by Neysa Bonitohristy were going to be mailed. Patient was told to give me a call if she had any further questions or concerns.

## 2016-03-16 NOTE — Anesthesia Postprocedure Evaluation (Signed)
Anesthesia Post Note  Patient: Lindsay AbrahamsEdilma Bates  Procedure(s) Performed: Procedure(s) (LRB): COLONOSCOPY WITH PROPOFOL (N/A) ESOPHAGOGASTRODUODENOSCOPY (EGD) WITH PROPOFOL (N/A)  Patient location during evaluation: PACU Anesthesia Type: MAC Level of consciousness: awake and alert Pain management: pain level controlled Vital Signs Assessment: post-procedure vital signs reviewed and stable Respiratory status: spontaneous breathing, nonlabored ventilation, respiratory function stable and patient connected to nasal cannula oxygen Cardiovascular status: stable and blood pressure returned to baseline Anesthetic complications: no    Worthy Boschert C

## 2016-03-17 ENCOUNTER — Encounter: Payer: Self-pay | Admitting: Gastroenterology

## 2016-03-23 ENCOUNTER — Inpatient Hospital Stay (HOSPITAL_BASED_OUTPATIENT_CLINIC_OR_DEPARTMENT_OTHER): Payer: Self-pay | Admitting: Hematology and Oncology

## 2016-03-23 ENCOUNTER — Encounter (INDEPENDENT_AMBULATORY_CARE_PROVIDER_SITE_OTHER): Payer: Self-pay

## 2016-03-23 ENCOUNTER — Inpatient Hospital Stay: Payer: Self-pay

## 2016-03-23 VITALS — BP 144/84 | HR 54 | Temp 96.4°F | Resp 18 | Wt 137.8 lb

## 2016-03-23 DIAGNOSIS — R634 Abnormal weight loss: Secondary | ICD-10-CM

## 2016-03-23 DIAGNOSIS — R42 Dizziness and giddiness: Secondary | ICD-10-CM

## 2016-03-23 DIAGNOSIS — K449 Diaphragmatic hernia without obstruction or gangrene: Secondary | ICD-10-CM

## 2016-03-23 DIAGNOSIS — Z79899 Other long term (current) drug therapy: Secondary | ICD-10-CM

## 2016-03-23 DIAGNOSIS — D509 Iron deficiency anemia, unspecified: Secondary | ICD-10-CM

## 2016-03-23 DIAGNOSIS — K648 Other hemorrhoids: Secondary | ICD-10-CM

## 2016-03-23 DIAGNOSIS — R319 Hematuria, unspecified: Secondary | ICD-10-CM

## 2016-03-23 DIAGNOSIS — N920 Excessive and frequent menstruation with regular cycle: Secondary | ICD-10-CM

## 2016-03-23 DIAGNOSIS — R238 Other skin changes: Secondary | ICD-10-CM

## 2016-03-23 DIAGNOSIS — R233 Spontaneous ecchymoses: Secondary | ICD-10-CM

## 2016-03-23 DIAGNOSIS — K862 Cyst of pancreas: Secondary | ICD-10-CM

## 2016-03-23 DIAGNOSIS — K863 Pseudocyst of pancreas: Secondary | ICD-10-CM

## 2016-03-23 DIAGNOSIS — R630 Anorexia: Secondary | ICD-10-CM

## 2016-03-23 LAB — CBC WITH DIFFERENTIAL/PLATELET
Basophils Absolute: 0.1 10*3/uL (ref 0–0.1)
Basophils Relative: 1 %
Eosinophils Absolute: 0.1 10*3/uL (ref 0–0.7)
Eosinophils Relative: 2 %
HCT: 38.8 % (ref 35.0–47.0)
Hemoglobin: 13.1 g/dL (ref 12.0–16.0)
Lymphocytes Relative: 29 %
Lymphs Abs: 1.9 10*3/uL (ref 1.0–3.6)
MCH: 24.8 pg — ABNORMAL LOW (ref 26.0–34.0)
MCHC: 33.6 g/dL (ref 32.0–36.0)
MCV: 73.6 fL — ABNORMAL LOW (ref 80.0–100.0)
Monocytes Absolute: 0.3 10*3/uL (ref 0.2–0.9)
Monocytes Relative: 5 %
Neutro Abs: 4.1 10*3/uL (ref 1.4–6.5)
Neutrophils Relative %: 63 %
Platelets: 219 10*3/uL (ref 150–440)
RBC: 5.27 MIL/uL — ABNORMAL HIGH (ref 3.80–5.20)
RDW: 28.4 % — ABNORMAL HIGH (ref 11.5–14.5)
WBC: 6.5 10*3/uL (ref 3.6–11.0)

## 2016-03-23 LAB — FERRITIN: Ferritin: 22 ng/mL (ref 11–307)

## 2016-03-23 NOTE — Progress Notes (Signed)
Offers no complaints today. States is feeling well. 

## 2016-03-23 NOTE — Progress Notes (Signed)
[facility]  Clinic day:  03/23/2016   Chief Complaint: Lindsay Bates is a 50 y.o. female with severe iron deficiency anemia who is referred in consultation by Dr Auburn Bilberry for assessment and management.  HPI: The patient was last seen in the medical oncology clinic on 02/24/2016.  At that time, she was seen for initial consultation regarding iron deficiency anemia.  She presented with severe right sided abdominal pain on 02/12/2016.  CBC revealed a hematocrit 16.1, hemoglobin of 4.6, and MCV of 52.7. Ferritin was 1.  She was transfused with 4 units of PRBCs and IV iron.  Guaiac cards were negative.  RUQ ultrasound revealed a septated 2.2 x 1.6 x 1.3 cm cystic lesion at the pancreatic head for which a follow-up MRI was recommended.    Abdominal MRI on 03/04/2016 revealed an18 mm cystic lesion within the junction of the pancreatic neck and body.  This most likely represents a pseudocyst. Indolent cystic neoplasm, including intraductal papillary mucinous tumor or oligocystic serous cystadenoma was felt less likely. Per consensus criteria, this warrants followup with pre and post contrast abdominal MRI at 1 year.   She noted heavy menses since 12/2015 (last menstrual day 02/07/2016). Transvaginal ultrasound on 02/13/2016 revealed a normal uterus and endometrium.  She noted easy bruising with spontaneous bruises.  She denied any aspirin use.  She rarely takes ibuprofen.  She had taken herbal products for 6 months.    She underwent a work-up.  CBC revealed a hematocrit 37.1, hemoglobin 11.8, MCV 69.9, platelets 224,000, white count 7700 with an ANC of 5500. Differential was unremarkable.  Urinalysis revealed a 2+ blood and 6-30 red cells per high-power field (no concurrent menses).  Reticulocyte count was 1.5%. Coombs was negative.  Bilirubin was 1.4.  Ferritin was 57 (improved from 1 from 02/12/2016). Von Willebrand panel was normal with a coagulation factor VIII  of 145%, ristocetin cofactor 147% and von Willebrand's antigen 198%.  EGD and colonoscopy on 03/16/2016 by Dr Midge Minium revealed a small hiatal hernia and non-bleeding internal hemorrhoids.  She notes no appetite for meat.  She is taking her oral iron TID.  She has lost 5 pounds.  She states her menses is heavy for 3 days. She uses 8 pads a day. She sometimes gets dizzy.   Past Medical History  Diagnosis Date  . Anemia   . Wears dentures     full upper and lower   . Transfusion history 02/12/16    2 units    Past Surgical History  Procedure Laterality Date  . Tubal ligation    . Colonoscopy with propofol N/A 03/16/2016    Procedure: COLONOSCOPY WITH PROPOFOL;  Surgeon: Midge Minium, MD;  Location: [facility];  Service: Endoscopy;  Laterality: N/A;  . Esophagogastroduodenoscopy (egd) with propofol N/A 03/16/2016    Procedure: ESOPHAGOGASTRODUODENOSCOPY (EGD) WITH PROPOFOL;  Surgeon: Midge Minium, MD;  Location: [facility];  Service: Endoscopy;  Laterality: N/A;  Needs interpreter    Family History  Problem Relation Age of Onset  . Alcohol abuse Neg Hx   . Arthritis Neg Hx   . Asthma Neg Hx   . Birth defects Neg Hx   . Cancer Neg Hx   . COPD Neg Hx   . Depression Neg Hx   . Diabetes Neg Hx   . Drug abuse Neg Hx   . Early death Neg Hx   . Hearing loss Neg Hx   . Heart disease Neg Hx   .  Hyperlipidemia Neg Hx   . Hypertension Neg Hx   . Kidney disease Neg Hx   . Learning disabilities Neg Hx   . Mental illness Neg Hx   . Mental retardation Neg Hx   . Miscarriages / Stillbirths Neg Hx   . Stroke Neg Hx   . Vision loss Neg Hx   . Varicose Veins Neg Hx     Social History:  reports that she has never smoked. She does not have any smokeless tobacco history on file. She reports that she does not drink alcohol. Her drug history is not on file.  She is from [place].  She moved to the [place] 18 years ago.  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  She has a 68  and 78 year old child.  The patient is accompanied by the interpreter today.  Allergies: No Known Allergies  Current Medications: Current Outpatient Prescriptions  Medication Sig Dispense Refill  . acetaminophen (TYLENOL) 325 MG tablet Take 650 mg by mouth every 6 (six) hours as needed.    . ferrous sulfate (IRON SUPPLEMENT) 325 (65 FE) MG tablet Take 1 tablet (325 mg total) by mouth 3 (three) times daily with meals. 90 tablet 3  . Multiple Vitamins-Iron (MULTIVITAMINS WITH IRON) TABS tablet Take 1 tablet by mouth daily. 30 tablet 0  . polyethylene glycol-electrolytes (TRILYTE) 420 g solution Take 4,000 mLs by mouth once. Tome 8 oz cada media hora hasta que su escremento este claro. 4000 mL 0   No current facility-administered medications for this visit.    Review of Systems:  GENERAL:  Feels "ok".  No fevers or sweats.  Weight loss of 5 pounds. PERFORMANCE STATUS (ECOG):  0 HEENT:  No visual changes, runny nose, sore throat, mouth sores or tenderness. Lungs: No shortness of breath or cough.  No hemoptysis. Cardiac:  No chest pain, palpitations, orthopnea, or PND. GI:  No appetite for meat.  No nausea, vomiting, diarrhea, constipation, melena or hematochezia. No prior colonoscopy. GU:  Heavy menses.  No urgency, frequency, dysuria, or hematuria. Musculoskeletal:  No back pain.  No joint pain.  No muscle tenderness. Extremities:  No pain or swelling. Skin:  Easy bruising.  No rashes or skin changes. Neuro:  No headache, numbness or weakness, balance or coordination issues. Endocrine:  No diabetes, thyroid issues, hot flashes or night sweats. Psych:  No mood changes, depression or anxiety. Pain:  No focal pain. Review of systems:  All other systems reviewed and found to be negative.  Physical Exam: Blood pressure 144/84, pulse 54, temperature 96.4 F (35.8 C), temperature source Tympanic, resp. rate 18, weight 137 lb 12.6 oz (62.5 kg), last menstrual period 03/03/2016. GENERAL:  Well  developed, well nourished, sitting comfortably in the exam room in no acute distress. MENTAL STATUS:  Alert and oriented to person, place and time. HEAD:  Long black hair.  Normocephalic, atraumatic, face symmetric, no Cushingoid features. EYES:  Brown eyes.  Pupils equal round and reactive to light and accomodation.  No conjunctivitis or scleral icterus. ENT:  Oropharynx clear without lesion.  Dentures.  Tongue normal. Mucous membranes moist.  RESPIRATORY:  Clear to auscultation without rales, wheezes or rhonchi. CARDIOVASCULAR:  Regular rate and rhythm without murmur, rub or gallop. ABDOMEN:  Soft, non-tender, with active bowel sounds, and no hepatosplenomegaly.  No masses. SKIN:  Tan.  No rashes, ulcers or lesions. EXTREMITIES: No edema, no skin discoloration or tenderness.  No palpable cords. LYMPH NODES: No palpable cervical, supraclavicular, axillary  or inguinal adenopathy  NEUROLOGICAL: Unremarkable. PSYCH:  Appropriate.  Appointment on 03/23/2016  Component Date Value Ref Range Status  . WBC 03/23/2016 6.5  3.6 - 11.0 K/uL Final  . RBC 03/23/2016 5.27* 3.80 - 5.20 MIL/uL Final  . Hemoglobin 03/23/2016 13.1  12.0 - 16.0 g/dL Final  . HCT 16/08/9603 38.8  35.0 - 47.0 % Final  . MCV 03/23/2016 73.6* 80.0 - 100.0 fL Final  . MCH 03/23/2016 24.8* 26.0 - 34.0 pg Final  . MCHC 03/23/2016 33.6  32.0 - 36.0 g/dL Final  . RDW 54/07/8118 28.4* 11.5 - 14.5 % Final  . Platelets 03/23/2016 219  150 - 440 K/uL Final  . Neutrophils Relative % 03/23/2016 63   Final  . Neutro Abs 03/23/2016 4.1  1.4 - 6.5 K/uL Final  . Lymphocytes Relative 03/23/2016 29   Final  . Lymphs Abs 03/23/2016 1.9  1.0 - 3.6 K/uL Final  . Monocytes Relative 03/23/2016 5   Final  . Monocytes Absolute 03/23/2016 0.3  0.2 - 0.9 K/uL Final  . Eosinophils Relative 03/23/2016 2   Final  . Eosinophils Absolute 03/23/2016 0.1  0 - 0.7 K/uL Final  . Basophils Relative 03/23/2016 1   Final  . Basophils Absolute 03/23/2016 0.1   0 - 0.1 K/uL Final    Assessment:  Lindsay Bates is a 50 y.o. female with severe iron deficiency anemia.  GI work-up was unrevealing.  She has hematuria and heavy menses.  She presented with severe right sided abdominal pain on 02/12/2016.  CBC revealed a hematocrit 16.1, hemoglobin of 4.6, and MCV of 52.7. Ferritin was 1.  Iron was < 5, TIBC was 401, iron saturation could not be calculated.  B12 and folate were normal.  Retic was 3.1%.    She was transfused with 4 units of PRBCs and IV iron.  Guaiac cards were negative.  EGD and colonoscopy on 03/16/2016 revealed a small hiatal hernia and non-bleeding internal hemorrhoid.  Work-up on 02/24/2016 revealed a hematocrit 37.1, hemoglobin 11.8, MCV 69.9, platelets 224,000, white count 7700 with an ANC of 5500. Differential was unremarkable.  Urinalysis revealed a 2+ blood and 6-30 red cells per high-power field (no concurrent menses).  Reticulocyte count was 1.5%.  Coombs was negative.  Bilirubin was 1.4.  Ferritin was 57 (improved from 1 from 02/12/2016) then decreased to 22 on 03/23/2016. Von Willebrand panel was normal.  RUQ ultrasound on 02/12/2016 revealed a septated 2.2 x 1.6 x 1.3 cm cystic lesion at the pancreatic head.  Abdominal MRI on 03/04/2016 revealed an18 mm cystic lesion within the junction of the pancreatic neck and body.  This most likely represents a pseudocyst. Indolent cystic neoplasm, including intraductal papillary mucinous tumor or oligocystic serous cystadenoma was felt less likely.  She has heavy menses since 12/2015 (last menstrual day 02/07/2016). Transvaginal ultrasound on 02/13/2016 revealed a normal uterus and endometrium.  She notes easy bruising with spontaneous bruises.  She denies any aspirin uses.  She takes 2 ibuprofen a month.  She has taken herbal products for 6 months.  von Willebrand testing was normal.  Symptomatically, her appetite is poor.  She has lost 5-6 pounds in the past month.  She denies any ice pica.  She  is on ferrous sulfate 325 mg TID.  Hematocrit is 38.8.  Ferritin is 22.    Plan: 1.  Review work-up from last visit.  Discuss hematuria (urinalysis performed without active menses).  No history of renal stones.  Discuss referral to urology.  2.  Labs today:  CBC with diff, ferritin. 3.  Discuss improvement in counts.  Discuss continuation of oral iron.  Ferritin goal 100. 4.  Review MRI abdomen.  Follow-up imaging in 1 year. 5.  Follow-up with gynecology re: heavy menses. 6.  Referral to urology re: hematuria. 7.  RTC in 2 months for MD assessment and labs (CBC with diff, ferritin).   Rosey BathMelissa C Corcoran, MD  03/23/2016, 3:34 PM

## 2016-03-27 ENCOUNTER — Telehealth: Payer: Self-pay

## 2016-03-27 ENCOUNTER — Other Ambulatory Visit: Payer: Self-pay

## 2016-03-27 NOTE — Telephone Encounter (Signed)
EUS order faxed to Kristi to schedule.

## 2016-03-27 NOTE — Telephone Encounter (Signed)
-----   Message from Benita GutterKristi D Stanton, RN sent at 03/24/2016 10:24 AM EDT ----- Regarding: Needs order Good morning Jamye Balicki. Will you fax me an order for her EUS to [phone]

## 2016-03-29 ENCOUNTER — Encounter: Payer: Self-pay | Admitting: Hematology and Oncology

## 2016-03-29 DIAGNOSIS — R319 Hematuria, unspecified: Secondary | ICD-10-CM | POA: Insufficient documentation

## 2016-04-03 ENCOUNTER — Other Ambulatory Visit: Payer: Self-pay

## 2016-04-06 ENCOUNTER — Ambulatory Visit (INDEPENDENT_AMBULATORY_CARE_PROVIDER_SITE_OTHER): Payer: Self-pay | Admitting: Urology

## 2016-04-06 ENCOUNTER — Encounter: Payer: Self-pay | Admitting: Urology

## 2016-04-06 VITALS — BP 123/70 | HR 57 | Ht 63.0 in | Wt 140.1 lb

## 2016-04-06 DIAGNOSIS — R3129 Other microscopic hematuria: Secondary | ICD-10-CM

## 2016-04-06 LAB — URINALYSIS, COMPLETE
BILIRUBIN UA: NEGATIVE
GLUCOSE, UA: NEGATIVE
KETONES UA: NEGATIVE
Leukocytes, UA: NEGATIVE
NITRITE UA: NEGATIVE
Specific Gravity, UA: 1.015 (ref 1.005–1.030)
UUROB: 0.2 mg/dL (ref 0.2–1.0)
pH, UA: 6 (ref 5.0–7.5)

## 2016-04-06 LAB — MICROSCOPIC EXAMINATION

## 2016-04-06 NOTE — Progress Notes (Signed)
04/06/2016 4:22 PM   Lindsay Bates 05/29/1966 [id number]  Referring provider: [facility] [address] [place], [place] [address]  Chief Complaint  Patient presents with  . Hematuria    referred by Nelva NayMelissa Corcoran    HPI: Patient is a 50 -year-old Hispanic female who presents today, with interpreter [name],  as a referral from Dr. Merlene Pullingorcoran for microscopic hematuria.  Patient was found to have microscopic hematuria on 02/24/2016 with 6-30 RBC's/hpf.  Patient confirms that she was not having her menses at that time.  Patient does have a prior history of microscopic hematuria.    She does not have a prior history of recurrent urinary tract infections, nephrolithiasis, trauma to the genitourinary tract or malignancies of the genitourinary tract.   She does not have a family medical history of nephrolithiasis, malignancies of the genitourinary tract or hematuria.   Today, she is not having symptoms of frequent urination, urgency, dysuria, nocturia, incontinence, hesitancy, intermittency, straining to urinate or a weak urinary stream.  Her UA today demonstrates 3-10RBC's/hpf, but she stated she is on her menses today.  She is experiencing right sided flank pain that started yesterday.  She is not experiencing any suprapubic pain or abdominal pain.  She denies any recent fevers, chills, nausea or vomiting.   She has had a MR abdomen w/wo contrast and no GU pathology was noted.    She not a smoker.   She is not exposed to secondhand smoke.  She has not worked with [employer].    PMH: Past Medical History  Diagnosis Date  . Anemia   . Wears dentures     full upper and lower   . Transfusion history 02/12/16    2 units  . Hematuria     Surgical History: Past Surgical History  Procedure Laterality Date  . Tubal ligation    . Colonoscopy with propofol N/A 03/16/2016    Procedure: COLONOSCOPY WITH PROPOFOL;  Surgeon: Midge Miniumarren Wohl,  MD;  [facility];  Service: Endoscopy;  Laterality: N/A;  . Esophagogastroduodenoscopy (egd) with propofol N/A 03/16/2016    Procedure: ESOPHAGOGASTRODUODENOSCOPY (EGD) WITH PROPOFOL;  Surgeon: Midge Miniumarren Wohl, MD;  [facility];  Service: Endoscopy;  Laterality: N/A;  Needs interpreter    Home Medications:    Medication List       This list is accurate as of: 04/06/16  4:22 PM.  Always use your most recent med list.               acetaminophen 325 MG tablet  Commonly known as:  TYLENOL  Take 650 mg by mouth every 6 (six) hours as needed.     ferrous sulfate 325 (65 FE) MG tablet  Commonly known as:  IRON SUPPLEMENT  Take 1 tablet (325 mg total) by mouth 3 (three) times daily with meals.     multivitamins with iron Tabs tablet  Take 1 tablet by mouth daily.     polyethylene glycol-electrolytes 420 g solution  Commonly known as:  TRILYTE  Take 4,000 mLs by mouth once. Tome 8 oz cada media hora hasta que su escremento este claro.        Allergies: No Known Allergies  Family History: Family History  Problem Relation Age of Onset  . Alcohol abuse Neg Hx   . Arthritis Neg Hx   . Asthma Neg Hx   . Birth defects Neg Hx   . Cancer Neg Hx   . COPD  Neg Hx   . Depression Neg Hx   . Diabetes Neg Hx   . Drug abuse Neg Hx   . Early death Neg Hx   . Hearing loss Neg Hx   . Heart disease Neg Hx   . Hyperlipidemia Neg Hx   . Hypertension Neg Hx   . Kidney disease Neg Hx   . Learning disabilities Neg Hx   . Mental illness Neg Hx   . Mental retardation Neg Hx   . Miscarriages / Stillbirths Neg Hx   . Stroke Neg Hx   . Vision loss Neg Hx   . Varicose Veins Neg Hx   . Kidney disease Neg Hx     Social History:  reports that she has never smoked. She does not have any smokeless tobacco history on file. She reports that she does not drink alcohol or use illicit drugs.  ROS: UROLOGY Frequent Urination?: No Hard to postpone urination?:  No Burning/pain with urination?: No Get up at night to urinate?: No Leakage of urine?: No Urine stream starts and stops?: No Trouble starting stream?: No Do you have to strain to urinate?: No Blood in urine?: No Urinary tract infection?: No Sexually transmitted disease?: No Injury to kidneys or bladder?: No Painful intercourse?: No Weak stream?: No Currently pregnant?: No Vaginal bleeding?: No Last menstrual period?: n  Gastrointestinal Nausea?: No Vomiting?: No Indigestion/heartburn?: No Diarrhea?: No Constipation?: No  Constitutional Fever: No Night sweats?: No Weight loss?: No Fatigue?: No  Skin Skin rash/lesions?: No Itching?: No  Eyes Blurred vision?: No Double vision?: No  Ears/Nose/Throat Sore throat?: No Sinus problems?: No  Hematologic/Lymphatic Swollen glands?: No Easy bruising?: No  Cardiovascular Leg swelling?: No Chest pain?: No  Respiratory Cough?: No Shortness of breath?: No  Endocrine Excessive thirst?: No  Musculoskeletal Back pain?: No Joint pain?: No  Neurological Headaches?: No Dizziness?: No  Psychologic Depression?: No Anxiety?: No  Physical Exam: BP 123/70 mmHg  Pulse 57  Ht  (1.6 m)  Wt 140 lb 1.6 oz (63.549 kg)  BMI 24.82 kg/m2  LMP 03/30/2016  Constitutional: Well nourished. Alert and oriented, No acute distress. HEENT: [place] AT, moist mucus membranes. Trachea midline, no masses. Cardiovascular: No clubbing, cyanosis, or edema. Respiratory: Normal respiratory effort, no increased work of breathing. GI: Abdomen is soft, non tender, non distended, no abdominal masses. Liver and spleen not palpable.  No hernias appreciated.  Stool sample for occult testing is not indicated.   GU: No CVA tenderness.  No bladder fullness or masses.   Skin: No rashes, bruises or suspicious lesions. Lymph: No cervical or inguinal adenopathy. Neurologic: Grossly intact, no focal deficits, moving all 4 extremities. Psychiatric:  Normal mood and affect.  Laboratory Data: Lab Results  Component Value Date   WBC 6.5 03/23/2016   HGB 13.1 03/23/2016   HCT 38.8 03/23/2016   MCV 73.6* 03/23/2016   PLT 219 03/23/2016    Lab Results  Component Value Date   CREATININE 1.00 02/24/2016      Lab Results  Component Value Date   AST 18 02/24/2016   Lab Results  Component Value Date   ALT 19 02/24/2016    Urinalysis Results for orders placed or performed in visit on 04/06/16  CULTURE, URINE COMPREHENSIVE  Result Value Ref Range   Urine Culture, Comprehensive Preliminary report (A)    Result 1 Comment (A)   Microscopic Examination  Result Value Ref Range   WBC, UA 0-5 0 -  5 /hpf  RBC, UA 3-10 (A) 0 -  2 /hpf   Epithelial Cells (non renal) 0-10 0 - 10 /hpf   Bacteria, UA Few (A) None seen/Few  Urinalysis, Complete  Result Value Ref Range   Specific Gravity, UA 1.015 1.005 - 1.030   pH, UA 6.0 5.0 - 7.5   Color, UA Yellow Yellow   Appearance Ur Clear Clear   Leukocytes, UA Negative Negative   Protein, UA 3+ (A) Negative/Trace   Glucose, UA Negative Negative   Ketones, UA Negative Negative   RBC, UA 3+ (A) Negative   Bilirubin, UA Negative Negative   Urobilinogen, Ur 0.2 0.2 - 1.0 mg/dL   Nitrite, UA Negative Negative   Microscopic Examination See below:    Pertinent Imaging: CLINICAL DATA: Generalized abdominal pain. Abdominal ultrasound demonstrating a pancreatic cystic lesion.  EXAM: MRI ABDOMEN WITHOUT AND WITH CONTRAST  TECHNIQUE: Multiplanar multisequence MR imaging of the abdomen was performed both before and after the administration of intravenous contrast.  CONTRAST: 14mL MULTIHANCE GADOBENATE DIMEGLUMINE 529 MG/ML IV SOLN  COMPARISON: Ultrasound 02/12/2016  FINDINGS: Lower chest: Mild cardiomegaly, without pericardial or pleural effusion.  Hepatobiliary: Central right hepatic lobe tiny cyst. No suspicious liver lesion. Normal gallbladder, without biliary ductal  dilatation. No evidence of choledocholithiasis.  Pancreas: Within the pancreatic neck/ body junction, a 1.8 x 1.2 by 1.8 cm cystic lesion is identified. Example image 15/series 3 and image 18/series 2. This may have a minimal complexity within, including on image 57/series 17. Intimately associated with the main pancreatic duct, and possibly communicating with.  No main pancreatic duct dilatation or evidence of acute pancreatitis.  Spleen: Normal in size, without focal abnormality.  Adrenals/Urinary Tract: Normal adrenal glands. Normal kidneys, without hydronephrosis.  Stomach/Bowel: Normal stomach and abdominal bowel loops.  Vascular/Lymphatic: Normal caliber of the aorta and branch vessels. No retroperitoneal or retrocrural adenopathy.  Other: No ascites.  Musculoskeletal: No acute osseous abnormality.  IMPRESSION: 1. 18 mm cystic lesion within the junction of the pancreatic neck and body. Most likely a pseudocyst. Indolent cystic neoplasm, including intraductal papillary mucinous tumor or oligocystic serous cystadenoma felt less likely. Per consensus criteria, this warrants followup with pre and post contrast abdominal MRI at 1 year. This recommendation follows ACR consensus guidelines: Managing Incidental Findings on Abdominal CT: White Paper of the ACR Incidental Findings Committee. J Am Coll Radiol 2010;7:754-773 2. No acute abdominal process.   Electronically Signed  By: Jeronimo Greaves M.D.  On: 03/04/2016 13:37        Assessment & Plan:    1. Microscopic hematuria:   Explained to patient the causes of blood in the urine are as follows: stones, UTI's, damage to the urinary tract and/or cancer.  Patient had a recent imaging of her kidneys with MR on 03/04/2016.  Given this and her low risk factors, such as no smoking history, I will not pursue a CT urogram at this time.  I will however schedule her for cystoscopy with one of our physicians.  We may  pursue a CT urogram in the future if findings on cystoscopy warrant it.  - Urinalysis, Complete - CULTURE, URINE COMPREHENSIVE   Return for schedule cystoscopy for hematuria.  These notes generated with voice recognition software. I apologize for typographical errors.  Michiel Cowboy, PA-C  [facility] [address] [place], [place] [address] [phone]

## 2016-04-10 ENCOUNTER — Encounter: Payer: Self-pay | Admitting: Urology

## 2016-04-10 DIAGNOSIS — R3129 Other microscopic hematuria: Secondary | ICD-10-CM | POA: Insufficient documentation

## 2016-04-10 LAB — CULTURE, URINE COMPREHENSIVE

## 2016-04-14 ENCOUNTER — Other Ambulatory Visit: Payer: Self-pay

## 2016-04-22 ENCOUNTER — Encounter: Payer: Self-pay | Admitting: *Deleted

## 2016-04-23 ENCOUNTER — Other Ambulatory Visit: Payer: Self-pay | Admitting: Internal Medicine

## 2016-04-23 ENCOUNTER — Ambulatory Visit: Payer: Self-pay | Admitting: Certified Registered Nurse Anesthetist

## 2016-04-23 ENCOUNTER — Encounter: Payer: Self-pay | Admitting: *Deleted

## 2016-04-23 ENCOUNTER — Encounter
Admission: RE | Disposition: A | Discharge status: Home / Self Care | Payer: Self-pay | Source: Ambulatory Visit | Attending: Internal Medicine

## 2016-04-23 ENCOUNTER — Ambulatory Visit
Admission: RE | Admit: 2016-04-23 | Discharge: 2016-04-23 | Disposition: A | Discharge status: Home / Self Care | Payer: Self-pay | Source: Ambulatory Visit | Attending: Internal Medicine | Admitting: Internal Medicine

## 2016-04-23 DIAGNOSIS — K862 Cyst of pancreas: Secondary | ICD-10-CM | POA: Insufficient documentation

## 2016-04-23 DIAGNOSIS — R3129 Other microscopic hematuria: Secondary | ICD-10-CM | POA: Insufficient documentation

## 2016-04-23 DIAGNOSIS — G709 Myoneural disorder, unspecified: Secondary | ICD-10-CM | POA: Insufficient documentation

## 2016-04-23 DIAGNOSIS — Z79899 Other long term (current) drug therapy: Secondary | ICD-10-CM | POA: Insufficient documentation

## 2016-04-23 HISTORY — PX: EUS: SHX5427

## 2016-04-23 SURGERY — UPPER ENDOSCOPIC ULTRASOUND (EUS) LINEAR
Anesthesia: General

## 2016-04-23 MED ORDER — CIPROFLOXACIN HCL 500 MG PO TABS: 500.0000 mg | ORAL_TABLET | Freq: Two times a day (BID) | ORAL | Status: AC

## 2016-04-23 MED ORDER — MIDAZOLAM HCL 2 MG/2ML IJ SOLN
INTRAMUSCULAR | Status: DC | PRN
  Administered 2016-04-23: 1 mg via INTRAVENOUS

## 2016-04-23 MED ORDER — ONDANSETRON HCL 4 MG/2ML IJ SOLN
INTRAMUSCULAR | Status: DC | PRN
  Administered 2016-04-23: 4 mg via INTRAVENOUS

## 2016-04-23 MED ORDER — PROPOFOL 10 MG/ML IV BOLUS
INTRAVENOUS | Status: DC | PRN
  Administered 2016-04-23 (×3): 20 mg via INTRAVENOUS

## 2016-04-23 MED ORDER — GLYCOPYRROLATE 0.2 MG/ML IJ SOLN
INTRAMUSCULAR | Status: DC | PRN
  Administered 2016-04-23: 0.2 mg via INTRAVENOUS

## 2016-04-23 MED ORDER — LIDOCAINE HCL (CARDIAC) 20 MG/ML IV SOLN
INTRAVENOUS | Status: DC | PRN
  Administered 2016-04-23: 30 mg via INTRAVENOUS

## 2016-04-23 MED ORDER — PROPOFOL 500 MG/50ML IV EMUL
INTRAVENOUS | Status: DC | PRN
  Administered 2016-04-23: 120 ug/kg/min via INTRAVENOUS

## 2016-04-23 MED ORDER — SODIUM CHLORIDE 0.9 % IV SOLN
INTRAVENOUS | Status: DC | PRN
  Administered 2016-04-23: 11:00:00 via INTRAVENOUS

## 2016-04-23 MED ORDER — FENTANYL CITRATE (PF) 100 MCG/2ML IJ SOLN
INTRAMUSCULAR | Status: DC | PRN
  Administered 2016-04-23: 50 ug via INTRAVENOUS

## 2016-04-23 MED ORDER — CIPROFLOXACIN IN D5W 400 MG/200ML IV SOLN
400.0000 mg | Freq: Once | INTRAVENOUS | Status: AC
  Administered 2016-04-23: 400 mg via INTRAVENOUS
  Filled 2016-04-23: qty 200

## 2016-04-23 NOTE — H&P (View-Only) (Signed)
04/06/2016 4:22 PM   Lindsay AbrahamsEdilma Bates 05/29/1966 [id number]  Referring provider: [facility] [address] [place], [place] [address]  Chief Complaint  Patient presents with  . Hematuria    referred by Nelva NayMelissa Corcoran    HPI: Patient is a 50 -year-old Hispanic female who presents today, with interpreter [name],  as a referral from Dr. Merlene Pullingorcoran for microscopic hematuria.  Patient was found to have microscopic hematuria on 02/24/2016 with 6-30 RBC's/hpf.  Patient confirms that she was not having her menses at that time.  Patient does have a prior history of microscopic hematuria.    She does not have a prior history of recurrent urinary tract infections, nephrolithiasis, trauma to the genitourinary tract or malignancies of the genitourinary tract.   She does not have a family medical history of nephrolithiasis, malignancies of the genitourinary tract or hematuria.   Today, she is not having symptoms of frequent urination, urgency, dysuria, nocturia, incontinence, hesitancy, intermittency, straining to urinate or a weak urinary stream.  Her UA today demonstrates 3-10RBC's/hpf, but she stated she is on her menses today.  She is experiencing right sided flank pain that started yesterday.  She is not experiencing any suprapubic pain or abdominal pain.  She denies any recent fevers, chills, nausea or vomiting.   She has had a MR abdomen w/wo contrast and no GU pathology was noted.    She not a smoker.   She is not exposed to secondhand smoke.  She has not worked with [employer].    PMH: Past Medical History  Diagnosis Date  . Anemia   . Wears dentures     full upper and lower   . Transfusion history 02/12/16    2 units  . Hematuria     Surgical History: Past Surgical History  Procedure Laterality Date  . Tubal ligation    . Colonoscopy with propofol N/A 03/16/2016    Procedure: COLONOSCOPY WITH PROPOFOL;  Surgeon: Midge Miniumarren Wohl,  MD;  [facility];  Service: Endoscopy;  Laterality: N/A;  . Esophagogastroduodenoscopy (egd) with propofol N/A 03/16/2016    Procedure: ESOPHAGOGASTRODUODENOSCOPY (EGD) WITH PROPOFOL;  Surgeon: Midge Miniumarren Wohl, MD;  [facility];  Service: Endoscopy;  Laterality: N/A;  Needs interpreter    Home Medications:    Medication List       This list is accurate as of: 04/06/16  4:22 PM.  Always use your most recent med list.               acetaminophen 325 MG tablet  Commonly known as:  TYLENOL  Take 650 mg by mouth every 6 (six) hours as needed.     ferrous sulfate 325 (65 FE) MG tablet  Commonly known as:  IRON SUPPLEMENT  Take 1 tablet (325 mg total) by mouth 3 (three) times daily with meals.     multivitamins with iron Tabs tablet  Take 1 tablet by mouth daily.     polyethylene glycol-electrolytes 420 g solution  Commonly known as:  TRILYTE  Take 4,000 mLs by mouth once. Tome 8 oz cada media hora hasta que su escremento este claro.        Allergies: No Known Allergies  Family History: Family History  Problem Relation Age of Onset  . Alcohol abuse Neg Hx   . Arthritis Neg Hx   . Asthma Neg Hx   . Birth defects Neg Hx   . Cancer Neg Hx   . COPD  Neg Hx   . Depression Neg Hx   . Diabetes Neg Hx   . Drug abuse Neg Hx   . Early death Neg Hx   . Hearing loss Neg Hx   . Heart disease Neg Hx   . Hyperlipidemia Neg Hx   . Hypertension Neg Hx   . Kidney disease Neg Hx   . Learning disabilities Neg Hx   . Mental illness Neg Hx   . Mental retardation Neg Hx   . Miscarriages / Stillbirths Neg Hx   . Stroke Neg Hx   . Vision loss Neg Hx   . Varicose Veins Neg Hx   . Kidney disease Neg Hx     Social History:  reports that she has never smoked. She does not have any smokeless tobacco history on file. She reports that she does not drink alcohol or use illicit drugs.  ROS: UROLOGY Frequent Urination?: No Hard to postpone urination?:  No Burning/pain with urination?: No Get up at night to urinate?: No Leakage of urine?: No Urine stream starts and stops?: No Trouble starting stream?: No Do you have to strain to urinate?: No Blood in urine?: No Urinary tract infection?: No Sexually transmitted disease?: No Injury to kidneys or bladder?: No Painful intercourse?: No Weak stream?: No Currently pregnant?: No Vaginal bleeding?: No Last menstrual period?: n  Gastrointestinal Nausea?: No Vomiting?: No Indigestion/heartburn?: No Diarrhea?: No Constipation?: No  Constitutional Fever: No Night sweats?: No Weight loss?: No Fatigue?: No  Skin Skin rash/lesions?: No Itching?: No  Eyes Blurred vision?: No Double vision?: No  Ears/Nose/Throat Sore throat?: No Sinus problems?: No  Hematologic/Lymphatic Swollen glands?: No Easy bruising?: No  Cardiovascular Leg swelling?: No Chest pain?: No  Respiratory Cough?: No Shortness of breath?: No  Endocrine Excessive thirst?: No  Musculoskeletal Back pain?: No Joint pain?: No  Neurological Headaches?: No Dizziness?: No  Psychologic Depression?: No Anxiety?: No  Physical Exam: BP 123/70 mmHg  Pulse 57  Ht  (1.6 m)  Wt 140 lb 1.6 oz (63.549 kg)  BMI 24.82 kg/m2  LMP 03/30/2016  Constitutional: Well nourished. Alert and oriented, No acute distress. HEENT: [place] AT, moist mucus membranes. Trachea midline, no masses. Cardiovascular: No clubbing, cyanosis, or edema. Respiratory: Normal respiratory effort, no increased work of breathing. GI: Abdomen is soft, non tender, non distended, no abdominal masses. Liver and spleen not palpable.  No hernias appreciated.  Stool sample for occult testing is not indicated.   GU: No CVA tenderness.  No bladder fullness or masses.   Skin: No rashes, bruises or suspicious lesions. Lymph: No cervical or inguinal adenopathy. Neurologic: Grossly intact, no focal deficits, moving all 4 extremities. Psychiatric:  Normal mood and affect.  Laboratory Data: Lab Results  Component Value Date   WBC 6.5 03/23/2016   HGB 13.1 03/23/2016   HCT 38.8 03/23/2016   MCV 73.6* 03/23/2016   PLT 219 03/23/2016    Lab Results  Component Value Date   CREATININE 1.00 02/24/2016      Lab Results  Component Value Date   AST 18 02/24/2016   Lab Results  Component Value Date   ALT 19 02/24/2016    Urinalysis Results for orders placed or performed in visit on 04/06/16  CULTURE, URINE COMPREHENSIVE  Result Value Ref Range   Urine Culture, Comprehensive Preliminary report (A)    Result 1 Comment (A)   Microscopic Examination  Result Value Ref Range   WBC, UA 0-5 0 -  5 /hpf  RBC, UA 3-10 (A) 0 -  2 /hpf   Epithelial Cells (non renal) 0-10 0 - 10 /hpf   Bacteria, UA Few (A) None seen/Few  Urinalysis, Complete  Result Value Ref Range   Specific Gravity, UA 1.015 1.005 - 1.030   pH, UA 6.0 5.0 - 7.5   Color, UA Yellow Yellow   Appearance Ur Clear Clear   Leukocytes, UA Negative Negative   Protein, UA 3+ (A) Negative/Trace   Glucose, UA Negative Negative   Ketones, UA Negative Negative   RBC, UA 3+ (A) Negative   Bilirubin, UA Negative Negative   Urobilinogen, Ur 0.2 0.2 - 1.0 mg/dL   Nitrite, UA Negative Negative   Microscopic Examination See below:    Pertinent Imaging: CLINICAL DATA: Generalized abdominal pain. Abdominal ultrasound demonstrating a pancreatic cystic lesion.  EXAM: MRI ABDOMEN WITHOUT AND WITH CONTRAST  TECHNIQUE: Multiplanar multisequence MR imaging of the abdomen was performed both before and after the administration of intravenous contrast.  CONTRAST: 14mL MULTIHANCE GADOBENATE DIMEGLUMINE 529 MG/ML IV SOLN  COMPARISON: Ultrasound 02/12/2016  FINDINGS: Lower chest: Mild cardiomegaly, without pericardial or pleural effusion.  Hepatobiliary: Central right hepatic lobe tiny cyst. No suspicious liver lesion. Normal gallbladder, without biliary ductal  dilatation. No evidence of choledocholithiasis.  Pancreas: Within the pancreatic neck/ body junction, a 1.8 x 1.2 by 1.8 cm cystic lesion is identified. Example image 15/series 3 and image 18/series 2. This may have a minimal complexity within, including on image 57/series 17. Intimately associated with the main pancreatic duct, and possibly communicating with.  No main pancreatic duct dilatation or evidence of acute pancreatitis.  Spleen: Normal in size, without focal abnormality.  Adrenals/Urinary Tract: Normal adrenal glands. Normal kidneys, without hydronephrosis.  Stomach/Bowel: Normal stomach and abdominal bowel loops.  Vascular/Lymphatic: Normal caliber of the aorta and branch vessels. No retroperitoneal or retrocrural adenopathy.  Other: No ascites.  Musculoskeletal: No acute osseous abnormality.  IMPRESSION: 1. 18 mm cystic lesion within the junction of the pancreatic neck and body. Most likely a pseudocyst. Indolent cystic neoplasm, including intraductal papillary mucinous tumor or oligocystic serous cystadenoma felt less likely. Per consensus criteria, this warrants followup with pre and post contrast abdominal MRI at 1 year. This recommendation follows ACR consensus guidelines: Managing Incidental Findings on Abdominal CT: White Paper of the ACR Incidental Findings Committee. J Am Coll Radiol 2010;7:754-773 2. No acute abdominal process.   Electronically Signed  By: Jeronimo Greaves M.D.  On: 03/04/2016 13:37        Assessment & Plan:    1. Microscopic hematuria:   Explained to patient the causes of blood in the urine are as follows: stones, UTI's, damage to the urinary tract and/or cancer.  Patient had a recent imaging of her kidneys with MR on 03/04/2016.  Given this and her low risk factors, such as no smoking history, I will not pursue a CT urogram at this time.  I will however schedule her for cystoscopy with one of our physicians.  We may  pursue a CT urogram in the future if findings on cystoscopy warrant it.  - Urinalysis, Complete - CULTURE, URINE COMPREHENSIVE   Return for schedule cystoscopy for hematuria.  These notes generated with voice recognition software. I apologize for typographical errors.  Michiel Cowboy, PA-C  [facility] [address] [place], [place] [address] [phone]

## 2016-04-23 NOTE — Transfer of Care (Signed)
Immediate Anesthesia Transfer of Care Note  Patient: Lindsay Bates  Procedure(s) Performed: Procedure(s): UPPER ENDOSCOPIC ULTRASOUND (EUS) LINEAR (N/A)  Patient Location: Endoscopy Unit  Anesthesia Type:General  Level of Consciousness: awake  Airway & Oxygen Therapy: Patient Spontanous Breathing  Post-op Assessment: Report given to RN  Post vital signs: Reviewed  Last Vitals:  Filed Vitals:   04/23/16 1022 04/23/16 1133  BP: 135/91 102/56  Pulse: 49 70  Temp: 36.5 C 36.1 C  Resp: 14 14    Last Pain: There were no vitals filed for this visit.       Complications: No apparent anesthesia complications

## 2016-04-23 NOTE — Anesthesia Preprocedure Evaluation (Addendum)
Anesthesia Evaluation  Patient identified by MRN, date of birth, ID band Patient awake    Reviewed: Allergy & Precautions, NPO status , Patient's Chart, lab work & pertinent test results  History of Anesthesia Complications Negative for: history of anesthetic complications  Airway Mallampati: II  TM Distance: >3 FB Neck ROM: Full    Dental no notable dental hx. (+) Missing, Poor Dentition, Upper Dentures, Lower Dentures   Pulmonary neg pulmonary ROS, neg shortness of breath,    Pulmonary exam normal breath sounds clear to auscultation       Cardiovascular Exercise Tolerance: Good negative cardio ROS Normal cardiovascular exam Rhythm:Regular Rate:Normal     Neuro/Psych  Neuromuscular disease negative psych ROS   GI/Hepatic Neg liver ROS, hiatal hernia,   Endo/Other  negative endocrine ROS  Renal/GU negative Renal ROS  negative genitourinary   Musculoskeletal negative musculoskeletal ROS (+)   Abdominal   Peds negative pediatric ROS (+)  Hematology  (+) anemia , History of transfusion   Anesthesia Other Findings Past Medical History:   Anemia                                                       Wears dentures                                                 Comment:full upper and lower    Transfusion history                             02/12/16        Comment:2 units   Hematuria                                                     Reproductive/Obstetrics negative OB ROS                           Anesthesia Physical  Anesthesia Plan  ASA: III  Anesthesia Plan: General   Post-op Pain Management:    Induction: Intravenous  Airway Management Planned:   Additional Equipment:   Intra-op Plan:   Post-operative Plan: Extubation in OR  Informed Consent: I have reviewed the patients History and Physical, chart, labs and discussed the procedure including the risks, benefits and  alternatives for the proposed anesthesia with the patient or authorized representative who has indicated his/her understanding and acceptance.   Dental advisory given  Plan Discussed with: CRNA  Anesthesia Plan Comments: (Consented via interpreter )       Anesthesia Quick Evaluation

## 2016-04-23 NOTE — Anesthesia Postprocedure Evaluation (Signed)
Anesthesia Post Note  Patient: Eldridge AbrahamsEdilma Fonte  Procedure(s) Performed: Procedure(s) (LRB): UPPER ENDOSCOPIC ULTRASOUND (EUS) LINEAR (N/A)  Patient location during evaluation: PACU Anesthesia Type: General Level of consciousness: awake and alert Pain management: pain level controlled Vital Signs Assessment: post-procedure vital signs reviewed and stable Respiratory status: spontaneous breathing, nonlabored ventilation, respiratory function stable and patient connected to nasal cannula oxygen Cardiovascular status: blood pressure returned to baseline and stable Postop Assessment: no signs of nausea or vomiting Anesthetic complications: no    Last Vitals:  Filed Vitals:   04/23/16 1213 04/23/16 1223  BP: 141/75 131/75  Pulse: 48 46  Temp:    Resp: 18 18    Last Pain: There were no vitals filed for this visit.               Yevette EdwardsJames G Adams

## 2016-04-23 NOTE — Op Note (Signed)
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 Providers:            Prudencio Pair. Lashonne Shull Referring MD:         Dr Leonette Most drew clinic, MD (Referring MD), Midge Minium,                        MD (Referring MD) Medicines:            Propofol per Anesthesia, Cipro 400 mg IV Complications:        No immediate complications. Procedure:            Pre-Anesthesia Assessment:                       Prior to the procedure, a History and Physical was                        performed, and patient medications and allergies were                        reviewed. The patient is competent. The risks and                        benefits of the procedure and the sedation options and                        risks were discussed with the patient. All questions                        were answered and informed consent was obtained.                        Patient identification and proposed procedure were                        verified by the physician, the nurse and the                        anesthetist in the pre-procedure area. Mental Status                        Examination: alert and oriented. Airway Examination:                        normal oropharyngeal airway and neck mobility.                        Respiratory Examination: clear to auscultation. CV                        Examination: normal. Prophylactic  Antibiotics: The                        patient does not require prophylactic antibiotics.                        Prior Anticoagulants: The patient has taken no previous              anticoagulant or antiplatelet agents. ASA Grade                        Assessment: II - A patient with mild systemic disease.                        After reviewing the risks and benefits, the patient was                        deemed in satisfactory condition to undergo the                        procedure. The anesthesia plan was to use monitored                        anesthesia care (MAC). Immediately prior to                        administration of medications, the patient was                        re-assessed for adequacy to receive sedatives. The                        heart rate, respiratory rate, oxygen saturations, blood                        pressure, adequacy of pulmonary ventilation, and                        response to care were monitored throughout the                        procedure. The physical status of the patient was                        re-assessed after the procedure.                       After obtaining informed consent, the endoscope was                        passed under direct vision. Throughout the procedure,                        the patient's blood pressure, pulse, and oxygen                        saturations were monitored continuously. The EUS GI                        Linear Array [id number] was introduced through the mouth,  and advanced to the duodenum for ultrasound examination                        from the esophagus, stomach and duodenum. The Endoscope                        was introduced through the mouth, and advanced to the                        second part of duodenum. The upper EUS was accomplished                        without difficulty. The patient tolerated the procedure                        well. Findings:      Endoscopic Finding :      The examined esophagus was endoscopically normal.      The entire examined stomach was endoscopically normal.      The examined duodenum was endoscopically  normal.      Endosonographic Finding :      An anechoic, multicystic lesion suggestive of a cyst was identified in       the genu of the pancreas. It is not in obvious communication with the       pancreatic duct. The lesion measured 20 mm by 13 mm in maximal       cross-sectional diameter. There were a few compartments thinly septated.       The outer wall of the lesion was not seen. There was no associated mass.       There was no internal debris within the fluid-filled cavity. Diagnostic       needle aspiration for fluid was performed. Color Doppler imaging was       utilized prior to needle puncture to confirm a lack of significant       vascular structures within the needle path. One pass was made with the       22 gauge [employer] Expect fine aspiration needle using a       transgastric approach. A stylet was used. The amount of fluid collected       was 1 mL. The fluid was clear and slightly viscous. Sample(s) were sent       for cytology and CEA (unable to aspirate a meaningful amount of fluid       for amylase).      There was no sign of significant endosonographic abnormality in the       entire pancreas.      There was no sign of significant endosonographic abnormality in the       common bile duct (1.8 mm) and in the common hepatic duct.      Endosonographic imaging in the left lobe of the liver showed no       abnormalities.      No lymphadenopathy seen.      The celiac region was visualized and showed no sign of significant       endosonographic abnormality. Impression:           EGD Impressions:                       - Normal esophagus.                       -  Normal stomach.                       - Normal examined duodenum.                       EUS Impressions:                       - A multicystic lesion with septations was seen in the                        genu of the pancreas. Cytology and cyst fluid CEA                        results are pending. 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                       - Otherwise, there was no sign of significant pathology                        in the entire pancreas.                       - There was no sign of significant pathology in the                        common bile duct and in the common hepatic duct.                       - Normal visualized portions of the liver.                       - No lymphadenopathy seen.                       - Normal celiac region. Recommendation:       - Discharge patient to home (ambulatory).                       - Await cytology results.                       - Cipro (ciprofloxacin) 500 mg PO BID for 5 days.                       - Refer to a pancreatic surgeon if a mucinous type cyst                        is confirmed by cytology and cyst fluid CEA.                       - Repeat a MRI scan of the pancreas in 1 year if no                        evidence of a  mucinous cyst on cytology and cyst fluid                        CEA results.                       - The findings and recommendations were discussed with                        the patient and her family.                       - Return to referring physician as previously scheduled. Procedure Code(s):    --- Professional ---                       [phone], Esophagogastroduodenoscopy, flexible, transoral;                        with transendoscopic ultrasound-guided intramural or                        transmural fine needle aspiration/biopsy(s) (includes                        endoscopic ultrasound examination of the esophagus,                        stomach, and either the duodenum or a surgically                        altered stomach where the jejunum is examined distal to                        the anastomosis) Diagnosis Code(s):    --- Professional ---                       K86.2, Cyst of  pancreas CPT copyright 2016 American Medical Association. All rights reserved. The codes documented in this report are preliminary and upon coder review may  be revised to meet current compliance requirements. Attending Participation:      I personally performed the entire procedure. Prudencio Pair Tyisha Cressy,  04/23/2016 11:33:01 AM This report has been signed electronically. Number of Addenda: 0 Note Initiated On: 04/23/2016 10:44 AM Estimated Blood Loss: Estimated blood loss: none.      [facility]

## 2016-04-23 NOTE — Interval H&P Note (Signed)
History and Physical Interval Note:  04/23/2016 10:44 AM  Eldridge AbrahamsEdilma Masden  has presented today for surgery, with the diagnosis of PANCREATIC CYSTIC LESION  The various methods of treatment have been discussed with the patient and family. After consideration of risks, benefits and other options for treatment, the patient has consented to  Procedure(s): UPPER ENDOSCOPIC ULTRASOUND (EUS) LINEAR (N/A) as a surgical intervention .  The patient's history has been reviewed, patient examined, no change in status, stable for surgery.  I have reviewed the patient's chart and labs.  Questions were answered to the patient's satisfaction.     Mike GipBurbridge,  Kaitelyn Jamison

## 2016-04-24 ENCOUNTER — Encounter: Payer: Self-pay | Admitting: Internal Medicine

## 2016-04-24 NOTE — Progress Notes (Signed)
  Oncology Nurse Navigator Documentation  Navigator Location: [facility] (04/24/16 1100) Navigator Encounter Type: Diagnostic Results (04/24/16 1100)                                          Time Spent with Patient: 15 (04/24/16 1100)   EUS routed to Dr Servando SnareWohl

## 2016-04-28 LAB — CYTOLOGY - NON PAP

## 2016-05-05 ENCOUNTER — Ambulatory Visit (INDEPENDENT_AMBULATORY_CARE_PROVIDER_SITE_OTHER): Payer: Self-pay | Admitting: Urology

## 2016-05-05 ENCOUNTER — Encounter: Payer: Self-pay | Admitting: Urology

## 2016-05-05 VITALS — BP 142/92 | HR 60 | Ht 63.0 in | Wt 141.7 lb

## 2016-05-05 DIAGNOSIS — R3129 Other microscopic hematuria: Secondary | ICD-10-CM

## 2016-05-05 DIAGNOSIS — R319 Hematuria, unspecified: Secondary | ICD-10-CM

## 2016-05-05 LAB — URINALYSIS, COMPLETE
BILIRUBIN UA: NEGATIVE
GLUCOSE, UA: NEGATIVE
Ketones, UA: NEGATIVE
Nitrite, UA: NEGATIVE
PH UA: 5 (ref 5.0–7.5)
Specific Gravity, UA: <1.005 — ABNORMAL LOW (ref 1.005–1.030)
UUROB: 0.2 mg/dL (ref 0.2–1.0)

## 2016-05-05 LAB — MICROSCOPIC EXAMINATION: Bacteria, UA: NONE SEEN

## 2016-05-05 MED ORDER — LIDOCAINE HCL 2 % EX GEL
1.0000 "application " | Freq: Once | CUTANEOUS | Status: AC
  Administered 2016-05-05: 1 via URETHRAL

## 2016-05-05 MED ORDER — CIPROFLOXACIN HCL 500 MG PO TABS
500.0000 mg | ORAL_TABLET | Freq: Once | ORAL | Status: AC
  Administered 2016-05-05: 500 mg via ORAL

## 2016-05-05 NOTE — Progress Notes (Signed)
04/06/2016 4:22 PM   Eldridge AbrahamsEdilma Vegh Feb 02, 1966 [id number]  Referring provider: Phineas Realharles Drew [facility] [address] [place], [place] [id number]  Chief Complaint  Patient presents with  . Hematuria    referred by Nelva NayMelissa Corcoran    HPI:  1 - Microscopic Hematuria - sig blood on UA noted by PCP x several 2017. No gross episodes. Non-smoker. No chronic solvent exposure. MRI abd-pelvis 2017 w/o hydro or GU masses. Cysto 04/2016 XXXX.   Today "Petrita" is seen for cysto to complete hematuria eval.  02/24/2016 with 6-30 RBC's/hpf.  Patient confirms that she was not having her menses at that time.  Patient does have a prior history of microscopic hematuria.      PMH: Past Medical History  Diagnosis Date  . Anemia   . Wears dentures     full upper and lower   . Transfusion history 02/12/16    2 units  . Hematuria     Surgical History: Past Surgical History  Procedure Laterality Date  . Tubal ligation    . Colonoscopy with propofol N/A 03/16/2016    Procedure: COLONOSCOPY WITH PROPOFOL;  Surgeon: Midge Miniumarren Wohl, MD;  [facility];  Service: Endoscopy;  Laterality: N/A;  . Esophagogastroduodenoscopy (egd) with propofol N/A 03/16/2016    Procedure: ESOPHAGOGASTRODUODENOSCOPY (EGD) WITH PROPOFOL;  Surgeon: Midge Miniumarren Wohl, MD;  [facility];  Service: Endoscopy;  Laterality: N/A;  Needs interpreter    Home Medications:    Medication List       This list is accurate as of: 04/06/16  4:22 PM.  Always use your most recent med list.               acetaminophen 325 MG tablet  Commonly known as:  TYLENOL  Take 650 mg by mouth every 6 (six) hours as needed.     ferrous sulfate 325 (65 FE) MG tablet  Commonly known as:  IRON SUPPLEMENT  Take 1 tablet (325 mg total) by mouth 3 (three) times daily with meals.     multivitamins with iron Tabs tablet  Take 1 tablet by mouth daily.     polyethylene glycol-electrolytes 420 g  solution  Commonly known as:  TRILYTE  Take 4,000 mLs by mouth once. Tome 8 oz cada media hora hasta que su escremento este claro.        Allergies: No Known Allergies  Family History: Family History  Problem Relation Age of Onset  . Alcohol abuse Neg Hx   . Arthritis Neg Hx   . Asthma Neg Hx   . Birth defects Neg Hx   . Cancer Neg Hx   . COPD Neg Hx   . Depression Neg Hx   . Diabetes Neg Hx   . Drug abuse Neg Hx   . Early death Neg Hx   . Hearing loss Neg Hx   . Heart disease Neg Hx   . Hyperlipidemia Neg Hx   . Hypertension Neg Hx   . Kidney disease Neg Hx   . Learning disabilities Neg Hx   . Mental illness Neg Hx   . Mental retardation Neg Hx   . Miscarriages / Stillbirths Neg Hx   . Stroke Neg Hx   . Vision loss Neg Hx   . Varicose Veins Neg Hx   . Kidney disease Neg Hx     Social History:  reports that she has never smoked. She does not have any smokeless tobacco history on file.  She reports that she does not drink alcohol or use illicit drugs.  ROS: UROLOGY Frequent Urination?: No Hard to postpone urination?: No Burning/pain with urination?: No Get up at night to urinate?: No Leakage of urine?: No Urine stream starts and stops?: No Trouble starting stream?: No Do you have to strain to urinate?: No Blood in urine?: No Urinary tract infection?: No Sexually transmitted disease?: No Injury to kidneys or bladder?: No Painful intercourse?: No Weak stream?: No Currently pregnant?: No Vaginal bleeding?: No Last menstrual period?: n  Gastrointestinal Nausea?: No Vomiting?: No Indigestion/heartburn?: No Diarrhea?: No Constipation?: No  Constitutional Fever: No Night sweats?: No Weight loss?: No Fatigue?: No  Skin Skin rash/lesions?: No Itching?: No  Eyes Blurred vision?: No Double vision?: No  Ears/Nose/Throat Sore throat?: No Sinus problems?: No  Hematologic/Lymphatic Swollen glands?: No Easy bruising?: No  Cardiovascular Leg  swelling?: No Chest pain?: No  Respiratory Cough?: No Shortness of breath?: No  Endocrine Excessive thirst?: No  Musculoskeletal Back pain?: No Joint pain?: No  Neurological Headaches?: No Dizziness?: No  Psychologic Depression?: No Anxiety?: No  Physical Exam: BP 123/70 mmHg  Pulse 57  Ht  (1.6 m)  Wt 140 lb 1.6 oz (63.549 kg)  BMI 24.82 kg/m2  LMP 03/30/2016  Constitutional: Well nourished. Alert and oriented, No acute distress. HEENT: [place] AT, moist mucus membranes. Trachea midline, no masses. Cardiovascular: No clubbing, cyanosis, or edema. Respiratory: Normal respiratory effort, no increased work of breathing. GI: Abdomen is soft, non tender, non distended, no abdominal masses. Liver and spleen not palpable.  No hernias appreciated.  Stool sample for occult testing is not indicated.   GU: No CVA tenderness.  No bladder fullness or masses.   Skin: No rashes, bruises or suspicious lesions. Lymph: No cervical or inguinal adenopathy. Neurologic: Grossly intact, no focal deficits, moving all 4 extremities. Psychiatric: Normal mood and affect.  Laboratory Data: Lab Results  Component Value Date   WBC 6.5 03/23/2016   HGB 13.1 03/23/2016   HCT 38.8 03/23/2016   MCV 73.6* 03/23/2016   PLT 219 03/23/2016    Lab Results  Component Value Date   CREATININE 1.00 02/24/2016      Cystoscopy Procedure Note  Patient identification was confirmed, informed consent was obtained, and patient was prepped using Betadine solution.  Lidocaine jelly was administered per urethral meatus.    Preoperative abx where received prior to procedure.    Procedure: - Flexible cystoscope introduced, without any difficulty.   - Thorough search of the bladder revealed:    normal urethral meatus    normal urothelium    no stones    no ulcers     no tumors    no urethral polyps    no trabeculation  - Ureteral orifices were normal in position and  appearance.  Post-Procedure: - Patient tolerated the procedure well   Assessment & Plan:    1. Microscopic hematuria -  Now s/p complete eval with imaging and cysto. She is low risk. Reinforced need for repeat eval for future gross episodes only.  2 - RTC 1 year with any provider to verify no interval gross hematuria, then prn if none.   [facility] [address] [place], [place] [address] [phone]

## 2016-05-25 ENCOUNTER — Telehealth: Payer: Self-pay

## 2016-05-25 ENCOUNTER — Inpatient Hospital Stay: Payer: Self-pay | Attending: Hematology and Oncology

## 2016-05-25 ENCOUNTER — Encounter: Payer: Self-pay | Admitting: Hematology and Oncology

## 2016-05-25 ENCOUNTER — Inpatient Hospital Stay (HOSPITAL_BASED_OUTPATIENT_CLINIC_OR_DEPARTMENT_OTHER): Payer: Self-pay | Admitting: Hematology and Oncology

## 2016-05-25 VITALS — BP 133/88 | HR 68 | Temp 97.2°F | Resp 17 | Ht 63.0 in | Wt 141.0 lb

## 2016-05-25 DIAGNOSIS — R630 Anorexia: Secondary | ICD-10-CM

## 2016-05-25 DIAGNOSIS — Z79899 Other long term (current) drug therapy: Secondary | ICD-10-CM | POA: Insufficient documentation

## 2016-05-25 DIAGNOSIS — R109 Unspecified abdominal pain: Secondary | ICD-10-CM | POA: Insufficient documentation

## 2016-05-25 DIAGNOSIS — R319 Hematuria, unspecified: Secondary | ICD-10-CM

## 2016-05-25 DIAGNOSIS — R634 Abnormal weight loss: Secondary | ICD-10-CM

## 2016-05-25 DIAGNOSIS — R238 Other skin changes: Secondary | ICD-10-CM

## 2016-05-25 DIAGNOSIS — K648 Other hemorrhoids: Secondary | ICD-10-CM

## 2016-05-25 DIAGNOSIS — N920 Excessive and frequent menstruation with regular cycle: Secondary | ICD-10-CM | POA: Insufficient documentation

## 2016-05-25 DIAGNOSIS — K449 Diaphragmatic hernia without obstruction or gangrene: Secondary | ICD-10-CM

## 2016-05-25 DIAGNOSIS — D509 Iron deficiency anemia, unspecified: Secondary | ICD-10-CM

## 2016-05-25 DIAGNOSIS — R233 Spontaneous ecchymoses: Secondary | ICD-10-CM

## 2016-05-25 LAB — CBC WITH DIFFERENTIAL/PLATELET
Basophils Absolute: 0 10*3/uL (ref 0–0.1)
Basophils Relative: 1 %
Eosinophils Absolute: 0.1 10*3/uL (ref 0–0.7)
Eosinophils Relative: 2 %
HCT: 36.4 % (ref 35.0–47.0)
Hemoglobin: 12.3 g/dL (ref 12.0–16.0)
Lymphocytes Relative: 28 %
Lymphs Abs: 1.5 10*3/uL (ref 1.0–3.6)
MCH: 27.1 pg (ref 26.0–34.0)
MCHC: 33.8 g/dL (ref 32.0–36.0)
MCV: 80 fL (ref 80.0–100.0)
Monocytes Absolute: 0.5 10*3/uL (ref 0.2–0.9)
Monocytes Relative: 10 %
Neutro Abs: 3.2 10*3/uL (ref 1.4–6.5)
Neutrophils Relative %: 59 %
Platelets: 231 10*3/uL (ref 150–440)
RBC: 4.56 MIL/uL (ref 3.80–5.20)
RDW: 13.7 % (ref 11.5–14.5)
WBC: 5.3 10*3/uL (ref 3.6–11.0)

## 2016-05-25 LAB — PROTIME-INR
INR: 0.95
Prothrombin Time: 12.9 seconds (ref 11.4–15.0)

## 2016-05-25 LAB — FERRITIN: Ferritin: 8 ng/mL — ABNORMAL LOW (ref 11–307)

## 2016-05-25 LAB — APTT: aPTT: 30 seconds (ref 24–36)

## 2016-05-25 NOTE — Progress Notes (Signed)
May 25th had a scan to look at cyst on pancreas.

## 2016-05-25 NOTE — Telephone Encounter (Signed)
Called pt. Left VM pt to call me back regarding needing another blood draw.

## 2016-05-25 NOTE — Progress Notes (Signed)
[facility]  Clinic day:  05/25/2016   Chief Complaint: Lindsay Bates is a 50 y.o. female with severe iron deficiency anemia who seen for 2 month assessment.  HPI: The patient was last seen in the medical oncology clinic on 03/23/2016.  At that time, her appetite was poor.  She had lost 5-6 pounds in the past month.  She denied any ice pica.  She was on ferrous sulfate 325 mg TID.  Hematocrit was 38.8.  Ferritin was 22.  We discussed continuation of oral iron.  She was referred to urology for hematuria.  She underwent cystoscopy on 05/05/2016 by Lindsay Bates.  Findings were negative.  During the interim, she has had no problems.  She is taking oral iron.  She continues to have heavy menses.  She has seen Lindsay Bates in the past from [facility].   Past Medical History  Diagnosis Date  . Anemia   . Wears dentures     full upper and lower   . Transfusion history 02/12/16    2 units  . Hematuria     Past Surgical History  Procedure Laterality Date  . Tubal ligation    . Colonoscopy with propofol N/A 03/16/2016    Procedure: COLONOSCOPY WITH PROPOFOL;  Surgeon: Lindsay Bates;  [facility];  Service: Endoscopy;  Laterality: N/A;  . Esophagogastroduodenoscopy (egd) with propofol N/A 03/16/2016    Procedure: ESOPHAGOGASTRODUODENOSCOPY (EGD) WITH PROPOFOL;  Surgeon: Lindsay Bates;  [facility];  Service: Endoscopy;  Laterality: N/A;  Needs interpreter  . Eus N/A 04/23/2016    Procedure: UPPER ENDOSCOPIC ULTRASOUND (EUS) LINEAR;  Surgeon: Lindsay Bates;  Location: [facility];  Service: Gastroenterology;  Laterality: N/A;    Family History  Problem Relation Age of Onset  . Alcohol abuse Neg Hx   . Arthritis Neg Hx   . Asthma Neg Hx   . Birth defects Neg Hx   . Cancer Neg Hx   . COPD Neg Hx   . Depression Neg Hx   . Diabetes Neg Hx   . Drug abuse Neg Hx   . Early death Neg Hx   . Hearing loss  Neg Hx   . Heart disease Neg Hx   . Hyperlipidemia Neg Hx   . Hypertension Neg Hx   . Kidney disease Neg Hx   . Learning disabilities Neg Hx   . Mental illness Neg Hx   . Mental retardation Neg Hx   . Miscarriages / Stillbirths Neg Hx   . Stroke Neg Hx   . Vision loss Neg Hx   . Varicose Veins Neg Hx   . Kidney disease Neg Hx     Social History:  reports that she has never smoked. She has never used smokeless tobacco. She reports that she does not drink alcohol or use illicit drugs.  [place]  [place] ago.  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  She has a 1112 and 390 year old child.  The patient is accompanied by her 49 year old daughter Lindsay BraunKaren.  Allergies: No Known Allergies  Current Medications: Current Outpatient Prescriptions  Medication Sig Dispense Refill  . ferrous sulfate (IRON SUPPLEMENT) 325 (65 FE) MG tablet Take 1 tablet (325 mg total) by mouth 3 (three) times daily with meals. 90 tablet 3  . Multiple Vitamins-Iron (MULTIVITAMINS WITH IRON) TABS tablet Take 1 tablet by mouth  daily. 30 tablet 0  . acetaminophen (TYLENOL) 325 MG tablet Take 650 mg by mouth every 6 (six) hours as needed. Reported on 05/25/2016     No current facility-administered medications for this visit.    Review of Systems:  GENERAL:  Feels "ok".  No fevers or sweats.  Weight gain of 3 pounds. PERFORMANCE STATUS (ECOG):  0 HEENT:  No visual changes, runny nose, sore throat, mouth sores or tenderness. Lungs: No shortness of breath or cough.  No hemoptysis. Cardiac:  No chest pain, palpitations, orthopnea, or PND. GI:  No nausea, vomiting, diarrhea, constipation, melena or hematochezia. No prior colonoscopy. GU:  Heavy menses.  No urgency, frequency, dysuria, or hematuria.  Interval cystoscopy. Musculoskeletal:  No back pain.  No joint pain.  No muscle tenderness. Extremities:  No pain or swelling. Skin:  Easy bruising.  No rashes or skin changes. Neuro:  No  headache, numbness or weakness, balance or coordination issues. Endocrine:  No diabetes, thyroid issues, hot flashes or night sweats. Psych:  No mood changes, depression or anxiety. Pain:  No focal pain. Review of systems:  All other systems reviewed and found to be negative.  Physical Exam: Blood pressure 133/88, pulse 68, temperature 97.2 F (36.2 C), temperature source Tympanic, resp. rate 17, height  (1.6 m), weight 140 lb 15.8 oz (63.95 kg). GENERAL:  Well developed, well nourished, sitting comfortably in the exam room in no acute distress. MENTAL STATUS:  Alert and oriented to person, place and time. HEAD:  Long black hair.  Normocephalic, atraumatic, face symmetric, no Cushingoid features. EYES:  Brown eyes.  Pupils equal round and reactive to light and accomodation.  No conjunctivitis or scleral icterus. ENT:  Oropharynx clear without lesion.  Dentures.  Tongue normal. Mucous membranes moist.  RESPIRATORY:  Clear to auscultation without rales, wheezes or rhonchi. CARDIOVASCULAR:  Regular rate and rhythm without murmur, rub or gallop. ABDOMEN:  Soft, non-tender, with active bowel sounds, and no hepatosplenomegaly.  No masses. SKIN:  Tan.  No rashes, ulcers or lesions. EXTREMITIES: No edema, no skin discoloration or tenderness.  No palpable cords. LYMPH NODES: No palpable cervical, supraclavicular, axillary or inguinal adenopathy  NEUROLOGICAL: Unremarkable. PSYCH:  Appropriate.  Appointment on 05/25/2016  Component Date Value Ref Range Status  . Prothrombin Time 05/25/2016 13.3  11.4 - 15.0 seconds Final  . INR 05/25/2016 0.99   Final  . WBC 05/25/2016 5.3  3.6 - 11.0 K/uL Final  . RBC 05/25/2016 4.56  3.80 - 5.20 MIL/uL Final  . Hemoglobin 05/25/2016 12.3  12.0 - 16.0 g/dL Final  . HCT 16/08/9603 36.4  35.0 - 47.0 % Final  . MCV 05/25/2016 80.0  80.0 - 100.0 fL Final  . MCH 05/25/2016 27.1  26.0 - 34.0 pg Final  . MCHC 05/25/2016 33.8  32.0 - 36.0 g/dL Final  . RDW  54/07/8118 13.7  11.5 - 14.5 % Final  . Platelets 05/25/2016 231  150 - 440 K/uL Final  . Neutrophils Relative % 05/25/2016 59   Final  . Neutro Abs 05/25/2016 3.2  1.4 - 6.5 K/uL Final  . Lymphocytes Relative 05/25/2016 28   Final  . Lymphs Abs 05/25/2016 1.5  1.0 - 3.6 K/uL Final  . Monocytes Relative 05/25/2016 10   Final  . Monocytes Absolute 05/25/2016 0.5  0.2 - 0.9 K/uL Final  . Eosinophils Relative 05/25/2016 2   Final  . Eosinophils Absolute 05/25/2016 0.1  0 - 0.7 K/uL Final  . Basophils Relative 05/25/2016 1  Final  . Basophils Absolute 05/25/2016 0.0  0 - 0.1 K/uL Final    Assessment:  Lindsay Bates is a 50 y.o. female with severe iron deficiency anemia.  GI work-up was unrevealing.  She has hematuria and heavy menses.  She presented with severe right sided abdominal pain on 02/12/2016.  CBC revealed a hematocrit 16.1, hemoglobin of 4.6, and MCV of 52.7. Ferritin was 1.  Iron was < 5, TIBC was 401, iron saturation could not be calculated.  B12 and folate were normal.  Retic was 3.1%.    She was transfused with 4 units of PRBCs and IV iron.  Guaiac cards were negative.  EGD and colonoscopy on 03/16/2016 revealed a small hiatal hernia and non-bleeding internal hemorrhoid.  Work-up on 02/24/2016 revealed a hematocrit 37.1, hemoglobin 11.8, MCV 69.9, platelets 224,000, white count 7700 with an ANC of 5500. Differential was unremarkable.  Urinalysis revealed a 2+ blood and 6-30 red cells per high-power field (no concurrent menses).  Reticulocyte count was 1.5%.  Coombs was negative.  Bilirubin was 1.4.  Ferritin was 57 (improved from 1 from 02/12/2016) then decreased to 22 on 03/23/2016. Von Willebrand panel was normal.  RUQ ultrasound on 02/12/2016 revealed a septated 2.2 x 1.6 x 1.3 cm cystic lesion at the pancreatic head.  Abdominal MRI on 03/04/2016 revealed an 18 mm cystic lesion within the junction of the pancreatic neck and body.  This most likely represents a pseudocyst. Indolent  cystic neoplasm, including intraductal papillary mucinous tumor or oligocystic serous cystadenoma was felt less likely.  She has heavy menses since 12/2015 (last menstrual day 02/07/2016). Transvaginal ultrasound on 02/13/2016 revealed a normal uterus and endometrium.  She notes easy bruising with spontaneous bruises.  She denies any aspirin uses.  She takes 2 ibuprofen a month.  She has taken herbal products for 6 months.  von Willebrand testing was normal.  Urinalysis revealed hematuria.  Cystoscopy on 05/05/2016 was negative  Symptomatically, she has heavy menses.  She has gained 3 pounds in the past month.  She is on ferrous sulfate 325 mg TID.  Hematocrit is 36.4.  Ferritin is 8.    Plan: 1.  Labs today:  CBC with diff, ferritin. 2.  Discuss continuation of oral iron.  Ferritin goal 100. 3.  Nurse to call patient with ferritin results. 4.  RTC in 3 months for Bates assessment and labs (CBC with diff, ferritin).  Addendum:  Secondary to drop in ferritin (and hematocrit) on oral iron, patient was contacted.  She wishes to pursue IV iron.   Rosey BathMelissa C Corcoran, Bates  05/25/2016, 3:32 PM

## 2016-05-26 ENCOUNTER — Other Ambulatory Visit: Payer: Self-pay | Admitting: Hematology and Oncology

## 2016-05-26 ENCOUNTER — Telehealth: Payer: Self-pay | Admitting: *Deleted

## 2016-05-26 DIAGNOSIS — D509 Iron deficiency anemia, unspecified: Secondary | ICD-10-CM

## 2016-05-26 NOTE — Telephone Encounter (Signed)
Pt called and she would like iv venofer since iron is not working orally with ferritin being so low.  Spoke to corcoran thinks that is fine. venofer 200 mg iv weekly x 3 then get cbc, ferritin 4 weeks after last dose.   made first venofer appt for 6/30 at 1:30  next appt venofer 200 mg  will need to be 7/7, then next venofer 7/14 then lab only for 8/11.  Pt will get her new appt card when she comes on friday but just mail one separately just in case she forgets.  she is spanish speaking but pt states she will have her son with her and no need for interpreter.

## 2016-05-26 NOTE — Telephone Encounter (Signed)
-----   Message from Rosey BathMelissa C Corcoran, MD sent at 05/25/2016  4:28 PM EDT ----- Regarding: Please call patient  Patient needs to continue oral iron.  May need IV iron in future if does not improve or hematocrit falls. Patient has heavy menses.  M  ----- Message -----    From: [facility]    Sent: 05/25/2016   2:38 PM      To: Rosey BathMelissa C Corcoran, MD

## 2016-05-29 ENCOUNTER — Other Ambulatory Visit: Payer: Self-pay | Admitting: Hematology and Oncology

## 2016-05-29 ENCOUNTER — Inpatient Hospital Stay: Payer: Self-pay

## 2016-05-29 VITALS — BP 120/69 | HR 52 | Temp 98.2°F | Resp 18

## 2016-05-29 DIAGNOSIS — D509 Iron deficiency anemia, unspecified: Secondary | ICD-10-CM

## 2016-05-29 MED ORDER — SODIUM CHLORIDE 0.9 % IV SOLN
Freq: Once | INTRAVENOUS | Status: AC
  Administered 2016-05-29: 14:00:00 via INTRAVENOUS
  Filled 2016-05-29: qty 1000

## 2016-05-29 MED ORDER — SODIUM CHLORIDE 0.9 % IV SOLN
200.0000 mg | Freq: Once | INTRAVENOUS | Status: AC
  Administered 2016-05-29: 200 mg via INTRAVENOUS
  Filled 2016-05-29: qty 10

## 2016-06-05 ENCOUNTER — Inpatient Hospital Stay: Payer: Self-pay

## 2016-06-09 ENCOUNTER — Other Ambulatory Visit: Payer: Self-pay | Admitting: Hematology and Oncology

## 2016-06-09 ENCOUNTER — Inpatient Hospital Stay: Payer: Self-pay | Attending: Hematology and Oncology

## 2016-06-09 DIAGNOSIS — D509 Iron deficiency anemia, unspecified: Secondary | ICD-10-CM | POA: Insufficient documentation

## 2016-06-12 ENCOUNTER — Inpatient Hospital Stay: Payer: Self-pay

## 2016-06-12 ENCOUNTER — Ambulatory Visit: Payer: Self-pay

## 2016-06-14 ENCOUNTER — Other Ambulatory Visit: Payer: Self-pay | Admitting: Hematology and Oncology

## 2016-06-15 ENCOUNTER — Inpatient Hospital Stay: Payer: Self-pay

## 2016-06-19 ENCOUNTER — Inpatient Hospital Stay: Payer: Self-pay

## 2016-06-19 VITALS — BP 125/74 | HR 49 | Temp 98.2°F | Resp 16

## 2016-06-19 DIAGNOSIS — D509 Iron deficiency anemia, unspecified: Secondary | ICD-10-CM

## 2016-06-19 MED ORDER — IRON SUCROSE 20 MG/ML IV SOLN
200.0000 mg | Freq: Once | INTRAVENOUS | Status: AC
  Administered 2016-06-19: 200 mg via INTRAVENOUS
  Filled 2016-06-19: qty 10

## 2016-06-19 MED ORDER — SODIUM CHLORIDE 0.9 % IV SOLN
Freq: Once | INTRAVENOUS | Status: AC
  Administered 2016-06-19: 14:00:00 via INTRAVENOUS
  Filled 2016-06-19: qty 1000

## 2016-07-10 ENCOUNTER — Encounter (INDEPENDENT_AMBULATORY_CARE_PROVIDER_SITE_OTHER): Payer: Self-pay

## 2016-07-10 ENCOUNTER — Inpatient Hospital Stay: Payer: Self-pay | Attending: Hematology and Oncology

## 2016-07-10 DIAGNOSIS — D509 Iron deficiency anemia, unspecified: Secondary | ICD-10-CM | POA: Insufficient documentation

## 2016-07-10 LAB — CBC WITH DIFFERENTIAL/PLATELET
Basophils Absolute: 0.1 10*3/uL (ref 0–0.1)
Basophils Relative: 1 %
Eosinophils Absolute: 0.2 10*3/uL (ref 0–0.7)
Eosinophils Relative: 3 %
HCT: 36.2 % (ref 35.0–47.0)
Hemoglobin: 12.3 g/dL (ref 12.0–16.0)
Lymphocytes Relative: 24 %
Lymphs Abs: 1.4 10*3/uL (ref 1.0–3.6)
MCH: 27.7 pg (ref 26.0–34.0)
MCHC: 34 g/dL (ref 32.0–36.0)
MCV: 81.4 fL (ref 80.0–100.0)
Monocytes Absolute: 0.3 10*3/uL (ref 0.2–0.9)
Monocytes Relative: 5 %
Neutro Abs: 3.8 10*3/uL (ref 1.4–6.5)
Neutrophils Relative %: 67 %
Platelets: 204 10*3/uL (ref 150–440)
RBC: 4.45 MIL/uL (ref 3.80–5.20)
RDW: 14.4 % (ref 11.5–14.5)
WBC: 5.7 10*3/uL (ref 3.6–11.0)

## 2016-07-10 LAB — FERRITIN: Ferritin: 29 ng/mL (ref 11–307)

## 2016-08-23 ENCOUNTER — Other Ambulatory Visit: Payer: Self-pay | Admitting: Hematology and Oncology

## 2016-08-23 NOTE — Progress Notes (Signed)
[facility]-  Cancer Center  Clinic day:  08/24/16   Chief Complaint: Lindsay Bates is a 50 y.o. female with severe iron deficiency anemia who seen for 3 month assessment.  HPI: The patient was last seen in the medical oncology clinic on 05/25/2016.  At that time, she is taking oral iron TID.  She continued to have heavy menses.   Hematocrit was 36.4.  Ferritin was 8. Secondary to drop in ferritin (and hematocrit) on oral iron, she was offered IV iron.  She received 200 mg Venofer on 05/29/2016 and 06/19/2016.  CBC on 07/10/2016 included a hematocrit 36.2, hemoglobin 12.3 and MCV 81.4.  Ferritin was 29.  Symptomatically, she feels "so so".  She is taking her iron TID with orange juice.  Last menses was 08/05/2016.  She received a Depo Provera shot on 08/20/2016.   Past Medical History:  Diagnosis Date  . Anemia   . Hematuria   . Transfusion history 02/12/16   2 units  . Wears dentures    full upper and lower     Past Surgical History:  Procedure Laterality Date  . COLONOSCOPY WITH PROPOFOL N/A 03/16/2016   Procedure: COLONOSCOPY WITH PROPOFOL;  Surgeon: Midge Miniumarren Wohl, MD;  [facility];  Service: Endoscopy;  Laterality: N/A;  . ESOPHAGOGASTRODUODENOSCOPY (EGD) WITH PROPOFOL N/A 03/16/2016   Procedure: ESOPHAGOGASTRODUODENOSCOPY (EGD) WITH PROPOFOL;  Surgeon: Midge Miniumarren Wohl, MD;  [facility];  Service: Endoscopy;  Laterality: N/A;  Needs interpreter  . EUS N/A 04/23/2016   Procedure: UPPER ENDOSCOPIC ULTRASOUND (EUS) LINEAR;  Surgeon: Bearl Mulberryebecca A Burbridge, MD;  Location: [facility] ENDOSCOPY;  Service: Gastroenterology;  Laterality: N/A;  . TUBAL LIGATION      Family History  Problem Relation Age of Onset  . Alcohol abuse Neg Hx   . Arthritis Neg Hx   . Asthma Neg Hx   . Birth defects Neg Hx   . Cancer Neg Hx   . COPD Neg Hx   . Depression Neg Hx   . Diabetes Neg Hx   . Drug abuse Neg Hx   . Early death Neg Hx   . Hearing loss  Neg Hx   . Heart disease Neg Hx   . Hyperlipidemia Neg Hx   . Hypertension Neg Hx   . Kidney disease Neg Hx   . Learning disabilities Neg Hx   . Mental illness Neg Hx   . Mental retardation Neg Hx   . Miscarriages / Stillbirths Neg Hx   . Stroke Neg Hx   . Vision loss Neg Hx   . Varicose Veins Neg Hx   . Kidney disease Neg Hx     Social History:  reports that she has never smoked. She has never used smokeless tobacco. She reports that she does not drink alcohol or use drugs.  [place]  [place].  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  She has a 3012 and 49 year old child.  The patient is accompanied by a [place] interpreter.  Allergies: No Known Allergies  Current Medications: Current Outpatient Prescriptions  Medication Sig Dispense Refill  . acetaminophen (TYLENOL) 325 MG tablet Take 650 mg by mouth every 6 (six) hours as needed. Reported on 05/25/2016    . ferrous sulfate (IRON SUPPLEMENT) 325 (65 FE) MG tablet Take 1 tablet (325 mg total) by mouth 3 (three) times daily with meals. 90 tablet 3  . Multiple Vitamins-Iron (MULTIVITAMINS WITH  IRON) TABS tablet Take 1 tablet by mouth daily. 30 tablet 0   No current facility-administered medications for this visit.     Review of Systems:  GENERAL:  Feels "so so".  No fevers or sweats.  Weight gain of 5 pounds. PERFORMANCE STATUS (ECOG):  0 HEENT:  No visual changes, runny nose, sore throat, mouth sores or tenderness. Lungs: No shortness of breath or cough.  No hemoptysis. Cardiac:  No chest pain, palpitations, orthopnea, or PND. GI:  No nausea, vomiting, diarrhea, constipation, melena or hematochezia. No prior colonoscopy. GU:  No menses since 08/05/2016.  No urgency, frequency, dysuria, or hematuria.  Interval cystoscopy. Musculoskeletal:  No back pain.  No joint pain.  No muscle tenderness. Extremities:  No pain or swelling. Skin:  Easy bruising.  No rashes or skin changes. Neuro:  No  headache, numbness or weakness, balance or coordination issues. Endocrine:  No diabetes, thyroid issues, hot flashes or night sweats. Psych:  No mood changes, depression or anxiety. Pain:  No focal pain. Review of systems:  All other systems reviewed and found to be negative.  Physical Exam: Blood pressure 124/73, pulse 60, temperature 97.9 F (36.6 C), temperature source Tympanic, resp. rate 18, weight 145 lb 11.6 oz (66.1 kg). GENERAL:  Well developed, well nourished, woman sitting comfortably in the exam room in no acute distress. MENTAL STATUS:  Alert and oriented to person, place and time. HEAD:  Wearing a [place] gray cap.  Long black hair.  Normocephalic, atraumatic, face symmetric, no Cushingoid features. EYES:  Brown eyes.  Pupils equal round and reactive to light and accomodation.  No conjunctivitis or scleral icterus. ENT:  Oropharynx clear without lesion.  Dentures.  Tongue normal. Mucous membranes moist.  RESPIRATORY:  Clear to auscultation without rales, wheezes or rhonchi. CARDIOVASCULAR:  Regular rate and rhythm without murmur, rub or gallop. ABDOMEN:  Soft, non-tender, with active bowel sounds, and no hepatosplenomegaly.  No masses. SKIN:  Tan.  No rashes, ulcers or lesions. EXTREMITIES: No edema, no skin discoloration or tenderness.  No palpable cords. LYMPH NODES: No palpable cervical, supraclavicular, axillary or inguinal adenopathy  NEUROLOGICAL: Unremarkable. PSYCH:  Appropriate.   Orders Only on 08/24/2016  Component Date Value Ref Range Status  . WBC 08/24/2016 7.6  3.6 - 11.0 K/uL Final  . RBC 08/24/2016 4.49  3.80 - 5.20 MIL/uL Final  . Hemoglobin 08/24/2016 12.4  12.0 - 16.0 g/dL Final  . HCT 16/08/9603 36.0  35.0 - 47.0 % Final  . MCV 08/24/2016 80.1  80.0 - 100.0 fL Final  . MCH 08/24/2016 27.5  26.0 - 34.0 pg Final  . MCHC 08/24/2016 34.3  32.0 - 36.0 g/dL Final  . RDW 54/07/8118 13.9  11.5 - 14.5 % Final  . Platelets 08/24/2016 234  150 - 440 K/uL Final   . Neutrophils Relative % 08/24/2016 72  % Final  . Neutro Abs 08/24/2016 5.5  1.4 - 6.5 K/uL Final  . Lymphocytes Relative 08/24/2016 19  % Final  . Lymphs Abs 08/24/2016 1.5  1.0 - 3.6 K/uL Final  . Monocytes Relative 08/24/2016 5  % Final  . Monocytes Absolute 08/24/2016 0.3  0.2 - 0.9 K/uL Final  . Eosinophils Relative 08/24/2016 3  % Final  . Eosinophils Absolute 08/24/2016 0.3  0 - 0.7 K/uL Final  . Basophils Relative 08/24/2016 1  % Final  . Basophils Absolute 08/24/2016 0.1  0 - 0.1 K/uL Final    Assessment:  Lindsay Bates is a 50 y.o. female  with severe iron deficiency anemia.  GI work-up was unrevealing.  She has hematuria and heavy menses.  She presented with severe right sided abdominal pain on 02/12/2016.  CBC revealed a hematocrit 16.1, hemoglobin of 4.6, and MCV of 52.7. Ferritin was 1.  Iron was < 5, TIBC was 401, iron saturation could not be calculated.  B12 and folate were normal.  Retic was 3.1%.    She was transfused with 4 units of PRBCs and IV iron.  Guaiac cards were negative.  EGD and colonoscopy on 03/16/2016 revealed a small hiatal hernia and non-bleeding internal hemorrhoid.  Work-up on 02/24/2016 revealed a hematocrit 37.1, hemoglobin 11.8, MCV 69.9, platelets 224,000, white count 7700 with an ANC of 5500. Differential was unremarkable.  Urinalysis revealed a 2+ blood and 6-30 red cells per high-power field (no concurrent menses).  Reticulocyte count was 1.5%.  Coombs was negative.  Bilirubin was 1.4.  Ferritin was 57 (improved from 1 from 02/12/2016) then decreased to 22 on 03/23/2016. Von Willebrand panel was normal.  RUQ ultrasound on 02/12/2016 revealed a septated 2.2 x 1.6 x 1.3 cm cystic lesion at the pancreatic head.  Abdominal MRI on 03/04/2016 revealed an 18 mm cystic lesion within the junction of the pancreatic neck and body.  This most likely represents a pseudocyst. Indolent cystic neoplasm, including intraductal papillary mucinous tumor or oligocystic  serous cystadenoma was felt less likely.  She has heavy menses since 12/2015 (last menstrual day 02/07/2016). Transvaginal ultrasound on 02/13/2016 revealed a normal uterus and endometrium.  She has not had a menses since 08/05/2016.  She received Depo Provera on 08/20/2016.  She notes easy bruising with spontaneous bruises.  She denies any aspirin uses.  She takes 2 ibuprofen a month.  She has taken herbal products for 6 months.  von Willebrand testing was normal.  Urinalysis revealed hematuria.  Cystoscopy on 05/05/2016 was negative  She received 200 mg Venofer on 05/29/2016 and 06/19/2016.  Symptomatically, she feels "so so".  She is on ferrous sulfate 325 mg TID.  Hematocrit is 36.  Ferritin is pending.    Plan: 1.  Labs today:  CBC with diff, ferritin. 2.  Influenza vaccine 3.  Call patient with ferritin level 4.  Continue oral iron until ferritin 100. 5.  RTC in 3 months for MD assessment, labs (CBC with diff, ferritin-day before) +/- Venofer.   Rosey Bath, MD  08/24/2016, 3:08 PM

## 2016-08-24 ENCOUNTER — Inpatient Hospital Stay: Payer: Self-pay

## 2016-08-24 ENCOUNTER — Other Ambulatory Visit: Payer: Self-pay | Admitting: *Deleted

## 2016-08-24 ENCOUNTER — Other Ambulatory Visit: Payer: Self-pay

## 2016-08-24 ENCOUNTER — Encounter: Payer: Self-pay | Admitting: Hematology and Oncology

## 2016-08-24 ENCOUNTER — Inpatient Hospital Stay: Payer: Self-pay | Attending: Hematology and Oncology | Admitting: Hematology and Oncology

## 2016-08-24 VITALS — BP 124/73 | HR 60 | Temp 97.9°F | Resp 18 | Wt 145.7 lb

## 2016-08-24 DIAGNOSIS — K649 Unspecified hemorrhoids: Secondary | ICD-10-CM | POA: Insufficient documentation

## 2016-08-24 DIAGNOSIS — N920 Excessive and frequent menstruation with regular cycle: Secondary | ICD-10-CM | POA: Insufficient documentation

## 2016-08-24 DIAGNOSIS — D509 Iron deficiency anemia, unspecified: Secondary | ICD-10-CM

## 2016-08-24 DIAGNOSIS — R319 Hematuria, unspecified: Secondary | ICD-10-CM | POA: Insufficient documentation

## 2016-08-24 DIAGNOSIS — Z23 Encounter for immunization: Secondary | ICD-10-CM

## 2016-08-24 DIAGNOSIS — Z79899 Other long term (current) drug therapy: Secondary | ICD-10-CM | POA: Insufficient documentation

## 2016-08-24 DIAGNOSIS — K449 Diaphragmatic hernia without obstruction or gangrene: Secondary | ICD-10-CM | POA: Insufficient documentation

## 2016-08-24 LAB — CBC WITH DIFFERENTIAL/PLATELET
Basophils Absolute: 0.1 10*3/uL (ref 0–0.1)
Basophils Relative: 1 %
Eosinophils Absolute: 0.3 10*3/uL (ref 0–0.7)
Eosinophils Relative: 3 %
HCT: 36 % (ref 35.0–47.0)
Hemoglobin: 12.4 g/dL (ref 12.0–16.0)
Lymphocytes Relative: 19 %
Lymphs Abs: 1.5 10*3/uL (ref 1.0–3.6)
MCH: 27.5 pg (ref 26.0–34.0)
MCHC: 34.3 g/dL (ref 32.0–36.0)
MCV: 80.1 fL (ref 80.0–100.0)
Monocytes Absolute: 0.3 10*3/uL (ref 0.2–0.9)
Monocytes Relative: 5 %
Neutro Abs: 5.5 10*3/uL (ref 1.4–6.5)
Neutrophils Relative %: 72 %
Platelets: 234 10*3/uL (ref 150–440)
RBC: 4.49 MIL/uL (ref 3.80–5.20)
RDW: 13.9 % (ref 11.5–14.5)
WBC: 7.6 10*3/uL (ref 3.6–11.0)

## 2016-08-24 LAB — FERRITIN: Ferritin: 15 ng/mL (ref 11–307)

## 2016-08-24 MED ORDER — INFLUENZA VAC SPLIT QUAD 0.5 ML IM SUSY
0.5000 mL | PREFILLED_SYRINGE | Freq: Once | INTRAMUSCULAR | Status: AC
  Administered 2016-08-24: 0.5 mL via INTRAMUSCULAR
  Filled 2016-08-24: qty 0.5

## 2016-08-24 NOTE — Progress Notes (Signed)
Patient is here for follow up, she would like her flu shot today. She is doing well no complaints

## 2016-08-25 ENCOUNTER — Telehealth: Payer: Self-pay | Admitting: *Deleted

## 2016-08-25 NOTE — Telephone Encounter (Signed)
Called pt to let her know that ferritin dropped to 15. MD rec: that she get 2 venofer infusions and pt can come this Friday 1:30 and then next Friday at 1:30 and she is agreeable to the infusion. Colette made appt for her.

## 2016-08-25 NOTE — Telephone Encounter (Signed)
-----   Message from Rosey BathMelissa C Corcoran, MD sent at 08/24/2016  5:17 PM EDT ----- Regarding: Would she like additional Venofer   Ferritin dropped on oral iron.  Consider 2 doses  M  ----- Message ----- From: Interface, [place] Sent: 08/24/2016   2:23 PM To: Rosey BathMelissa C Corcoran, MD

## 2016-08-28 ENCOUNTER — Inpatient Hospital Stay: Payer: Self-pay

## 2016-08-28 VITALS — BP 149/83 | HR 50 | Temp 97.0°F | Resp 18

## 2016-08-28 DIAGNOSIS — D509 Iron deficiency anemia, unspecified: Secondary | ICD-10-CM

## 2016-08-28 MED ORDER — SODIUM CHLORIDE 0.9 % IV SOLN
Freq: Once | INTRAVENOUS | Status: AC
  Administered 2016-08-28: 14:00:00 via INTRAVENOUS
  Filled 2016-08-28: qty 1000

## 2016-08-28 MED ORDER — SODIUM CHLORIDE 0.9 % IV SOLN
200.0000 mg | Freq: Once | INTRAVENOUS | Status: AC
  Administered 2016-08-28: 200 mg via INTRAVENOUS
  Filled 2016-08-28: qty 10

## 2016-09-04 ENCOUNTER — Inpatient Hospital Stay: Payer: Self-pay

## 2016-09-08 ENCOUNTER — Ambulatory Visit: Payer: Self-pay

## 2016-09-08 ENCOUNTER — Inpatient Hospital Stay: Payer: Self-pay

## 2016-09-09 NOTE — Progress Notes (Unsigned)
[name] met with patient today.  Lindsay Bates, interpreted.  Patient concerned about cost of medical bills.  [name] advised patient to reapply for financial assistance through [facility], making sure that she had all required documents submitted to avoid automatic denial.

## 2016-09-11 ENCOUNTER — Inpatient Hospital Stay: Payer: Self-pay | Attending: Hematology and Oncology

## 2016-09-11 ENCOUNTER — Other Ambulatory Visit: Payer: Self-pay | Admitting: Hematology and Oncology

## 2016-09-11 VITALS — BP 148/78 | HR 56 | Temp 96.1°F | Resp 18

## 2016-09-11 DIAGNOSIS — D500 Iron deficiency anemia secondary to blood loss (chronic): Secondary | ICD-10-CM | POA: Insufficient documentation

## 2016-09-11 DIAGNOSIS — Z79899 Other long term (current) drug therapy: Secondary | ICD-10-CM | POA: Insufficient documentation

## 2016-09-11 DIAGNOSIS — D509 Iron deficiency anemia, unspecified: Secondary | ICD-10-CM

## 2016-09-11 MED ORDER — IRON SUCROSE 20 MG/ML IV SOLN
200.0000 mg | Freq: Once | INTRAVENOUS | Status: AC
  Administered 2016-09-11: 200 mg via INTRAVENOUS
  Filled 2016-09-11 (×2): qty 10

## 2016-09-11 MED ORDER — SODIUM CHLORIDE 0.9 % IV SOLN
Freq: Once | INTRAVENOUS | Status: AC
  Administered 2016-09-11: 14:00:00 via INTRAVENOUS
  Filled 2016-09-11: qty 1000

## 2016-09-11 MED ORDER — SODIUM CHLORIDE 0.9 % IV SOLN: 200.0000 mg | Freq: Once | INTRAVENOUS | Status: DC

## 2016-11-13 ENCOUNTER — Other Ambulatory Visit: Payer: Self-pay

## 2016-11-13 ENCOUNTER — Inpatient Hospital Stay: Payer: Self-pay | Attending: Hematology and Oncology

## 2016-11-13 ENCOUNTER — Telehealth: Payer: Self-pay | Admitting: *Deleted

## 2016-11-13 DIAGNOSIS — D509 Iron deficiency anemia, unspecified: Secondary | ICD-10-CM

## 2016-11-13 LAB — COMPREHENSIVE METABOLIC PANEL
ALT: 14 U/L (ref 14–54)
AST: 19 U/L (ref 15–41)
Albumin: 3.9 g/dL (ref 3.5–5.0)
Alkaline Phosphatase: 46 U/L (ref 38–126)
Anion gap: 7 (ref 5–15)
BUN: 24 mg/dL — ABNORMAL HIGH (ref 6–20)
CO2: 17 mmol/L — ABNORMAL LOW (ref 22–32)
Calcium: 8.9 mg/dL (ref 8.9–10.3)
Chloride: 112 mmol/L — ABNORMAL HIGH (ref 101–111)
Creatinine, Ser: 1.21 mg/dL — ABNORMAL HIGH (ref 0.44–1.00)
GFR calc Af Amer: 60 mL/min — ABNORMAL LOW (ref >60)
GFR calc non Af Amer: 52 mL/min — ABNORMAL LOW (ref >60)
Glucose, Bld: 116 mg/dL — ABNORMAL HIGH (ref 65–99)
Potassium: 3.6 mmol/L (ref 3.5–5.1)
Sodium: 136 mmol/L (ref 135–145)
Total Bilirubin: 1.2 mg/dL (ref 0.3–1.2)
Total Protein: 6.9 g/dL (ref 6.5–8.1)

## 2016-11-13 LAB — FERRITIN: Ferritin: 51 ng/mL (ref 11–307)

## 2016-11-13 NOTE — Telephone Encounter (Signed)
I called pt to ask how she is doing. Her co2 level 17 and dr Merlene Pullingcorcoran wanted to know if she has been sick and when I checked with pt she had a cold. Her son and her on the phone at same time so he can ask her questions if she does not always understand everything I say in english. She states she is having no fever but congestion in her head. She does not feel like it is in her chest but she has already planned to call [facility] in am and get appt with Sandrea HughsJessica Rubio.  I told her that Dr. Merlene Pullingorcoran wants to make sure she is seen to make sure she gets well. She does not need iron on Monday and we will cancel her appt and change it to see corcoran in the new year.  She is agreeable and she will make sure she goes [facility].  I sent inbasket message to [facility] directly and also to jessice rubio individually with her labs and I told pt to mention to doctor tom. jsut in case it is not rubio and she is agreeable.

## 2016-11-13 NOTE — Telephone Encounter (Signed)
-----   Message from Rosey BathMelissa C Corcoran, MD sent at 11/13/2016  4:25 PM EST ----- Regarding: Contact patient and FAX/call labs to PCP  Bicarb low  Anything new?  M ----- Message ----- From: Interface, Lab In [place] Sent: 11/13/2016   1:56 PM To: Rosey BathMelissa C Corcoran, MD

## 2016-11-16 ENCOUNTER — Inpatient Hospital Stay: Payer: Self-pay | Admitting: Hematology and Oncology

## 2016-11-16 ENCOUNTER — Inpatient Hospital Stay: Payer: Self-pay

## 2016-11-16 ENCOUNTER — Other Ambulatory Visit: Payer: Self-pay

## 2017-05-03 NOTE — Progress Notes (Deleted)
04/06/2016 4:22 PM   [name] Apr 20, 1966 [id number]  Referring provider: [facility] [address] [place], [place] [address]  Chief Complaint  Patient presents with  . Hematuria    referred by Nelva NayMelissa Corcoran    HPI:  1 - Microscopic Hematuria - sig blood on UA noted by PCP x several 2017. No gross episodes. Non-smoker. No chronic solvent exposure. MRI abd-pelvis 2017 w/o hydro or GU masses. Cysto 04/2016 XXXX.   Today "[name]" is seen for cysto to complete hematuria eval.  02/24/2016 with 6-30 RBC's/hpf.  Patient confirms that she was not having her menses at that time.  Patient does have a prior history of microscopic hematuria.      PMH: Past Medical History  Diagnosis Date  . Anemia   . Wears dentures     full upper and lower   . Transfusion history 02/12/16    2 units  . Hematuria     Surgical History: Past Surgical History  Procedure Laterality Date  . Tubal ligation    . Colonoscopy with propofol N/A 03/16/2016    Procedure: COLONOSCOPY WITH PROPOFOL;  Surgeon: Midge Miniumarren Wohl, MD;  [facility];  Service: Endoscopy;  Laterality: N/A;  . Esophagogastroduodenoscopy (egd) with propofol N/A 03/16/2016    Procedure: ESOPHAGOGASTRODUODENOSCOPY (EGD) WITH PROPOFOL;  Surgeon: Midge Miniumarren Wohl, MD;  [facility];  Service: Endoscopy;  Laterality: N/A;  Needs interpreter    Home Medications:    Medication List       This list is accurate as of: 04/06/16  4:22 PM.  Always use your most recent med list.               acetaminophen 325 MG tablet  Commonly known as:  TYLENOL  Take 650 mg by mouth every 6 (six) hours as needed.     ferrous sulfate 325 (65 FE) MG tablet  Commonly known as:  IRON SUPPLEMENT  Take 1 tablet (325 mg total) by mouth 3 (three) times daily with meals.     multivitamins with iron Tabs tablet  Take 1 tablet by mouth daily.     polyethylene glycol-electrolytes 420 g  solution  Commonly known as:  TRILYTE  Take 4,000 mLs by mouth once. Tome 8 oz cada media hora hasta que su escremento este claro.        Allergies: No Known Allergies  Family History: Family History  Problem Relation Age of Onset  . Alcohol abuse Neg Hx   . Arthritis Neg Hx   . Asthma Neg Hx   . Birth defects Neg Hx   . Cancer Neg Hx   . COPD Neg Hx   . Depression Neg Hx   . Diabetes Neg Hx   . Drug abuse Neg Hx   . Early death Neg Hx   . Hearing loss Neg Hx   . Heart disease Neg Hx   . Hyperlipidemia Neg Hx   . Hypertension Neg Hx   . Kidney disease Neg Hx   . Learning disabilities Neg Hx   . Mental illness Neg Hx   . Mental retardation Neg Hx   . Miscarriages / Stillbirths Neg Hx   . Stroke Neg Hx   . Vision loss Neg Hx   . Varicose Veins Neg Hx   . Kidney disease Neg Hx     Social History:  reports that she has never smoked. She does not have any smokeless tobacco history on file.  She reports that she does not drink alcohol or use illicit drugs.  ROS: UROLOGY Frequent Urination?: No Hard to postpone urination?: No Burning/pain with urination?: No Get up at night to urinate?: No Leakage of urine?: No Urine stream starts and stops?: No Trouble starting stream?: No Do you have to strain to urinate?: No Blood in urine?: No Urinary tract infection?: No Sexually transmitted disease?: No Injury to kidneys or bladder?: No Painful intercourse?: No Weak stream?: No Currently pregnant?: No Vaginal bleeding?: No Last menstrual period?: n  Gastrointestinal Nausea?: No Vomiting?: No Indigestion/heartburn?: No Diarrhea?: No Constipation?: No  Constitutional Fever: No Night sweats?: No Weight loss?: No Fatigue?: No  Skin Skin rash/lesions?: No Itching?: No  Eyes Blurred vision?: No Double vision?: No  Ears/Nose/Throat Sore throat?: No Sinus problems?: No  Hematologic/Lymphatic Swollen glands?: No Easy bruising?: No  Cardiovascular Leg  swelling?: No Chest pain?: No  Respiratory Cough?: No Shortness of breath?: No  Endocrine Excessive thirst?: No  Musculoskeletal Back pain?: No Joint pain?: No  Neurological Headaches?: No Dizziness?: No  Psychologic Depression?: No Anxiety?: No  Physical Exam: *** Constitutional: Well nourished. Alert and oriented, No acute distress. HEENT: [place] AT, moist mucus membranes. Trachea midline, no masses. Cardiovascular: No clubbing, cyanosis, or edema. Respiratory: Normal respiratory effort, no increased work of breathing. GI: Abdomen is soft, non tender, non distended, no abdominal masses. Liver and spleen not palpable.  No hernias appreciated.  Stool sample for occult testing is not indicated.   GU: No CVA tenderness.  No bladder fullness or masses.   Skin: No rashes, bruises or suspicious lesions. Lymph: No cervical or inguinal adenopathy. Neurologic: Grossly intact, no focal deficits, moving all 4 extremities. Psychiatric: Normal mood and affect.  Laboratory Data: Lab Results  Component Value Date   WBC 6.5 03/23/2016   HGB 13.1 03/23/2016   HCT 38.8 03/23/2016   MCV 73.6* 03/23/2016   PLT 219 03/23/2016    Lab Results  Component Value Date   CREATININE 1.00 02/24/2016      Cystoscopy Procedure Note  Patient identification was confirmed, informed consent was obtained, and patient was prepped using Betadine solution.  Lidocaine jelly was administered per urethral meatus.    Preoperative abx where received prior to procedure.    Procedure: - Flexible cystoscope introduced, without any difficulty.   - Thorough search of the bladder revealed:    normal urethral meatus    normal urothelium    no stones    no ulcers     no tumors    no urethral polyps    no trabeculation  - Ureteral orifices were normal in position and appearance.  Post-Procedure: - Patient tolerated the procedure well   Assessment & Plan:    1. Microscopic hematuria -  Now s/p  complete eval with imaging and cysto. She is low risk. Reinforced need for repeat eval for future gross episodes only.  2 - RTC 1 year with any provider to verify no interval gross hematuria, then prn if none.   [facility] [address] [place], [place] [address] [phone]

## 2017-05-05 ENCOUNTER — Ambulatory Visit: Payer: Self-pay | Admitting: Urology

## 2017-05-05 ENCOUNTER — Encounter: Payer: Self-pay | Admitting: Urology

## 2017-08-18 ENCOUNTER — Telehealth: Payer: Self-pay

## 2017-08-18 NOTE — Telephone Encounter (Signed)
Spoke with pt regarding the repeat MRI. Pt stated she doesn't have health insurance and will need to think about scheduling. She will call me back if she decides to move forward with repeat MRI.

## 2017-08-18 NOTE — Telephone Encounter (Signed)
-----   Message from Rayann Heman, CMA sent at 06/09/2016  3:11 PM EDT ----- Pt needs repeat MRI in 1 year for monitoring pancreatic lesion

## 2017-09-07 NOTE — Telephone Encounter (Signed)
Mailed pt letter to remind her she is due for her 1 year MRI to monitor pancreatic lesion.

## 2017-10-11 IMAGING — MR MR ABDOMEN WO/W CM
12 of 21 series · 27 of 48 positions shown · IV contrast (multihance)
Comparison: Ultrasound 02/12/2016

CLINICAL DATA: Generalized abdominal pain. Abdominal ultrasound
demonstrating a pancreatic cystic lesion.

EXAM:
MRI ABDOMEN WITHOUT AND WITH CONTRAST
TECHNIQUE: Multiplanar multisequence MR imaging of the abdomen was performed
both before and after the administration of intravenous contrast.
CONTRAST:  14mL MULTIHANCE GADOBENATE DIMEGLUMINE 529 MG/ML IV SOLN

[Series 2: T2 · coronal · 8.0mm · 0.78mm/px · 2 of 25 slices shown]
[im 1/25]
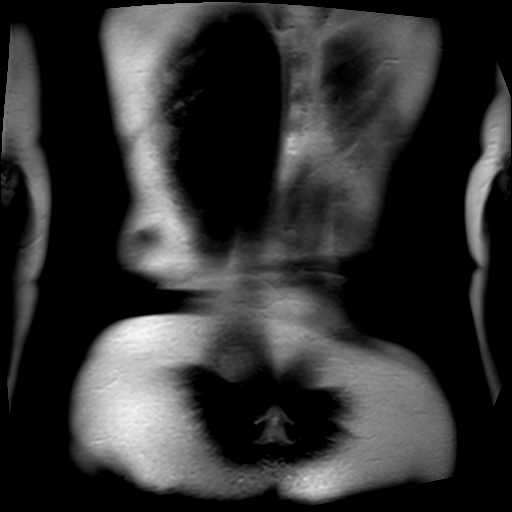
[im 25/25]
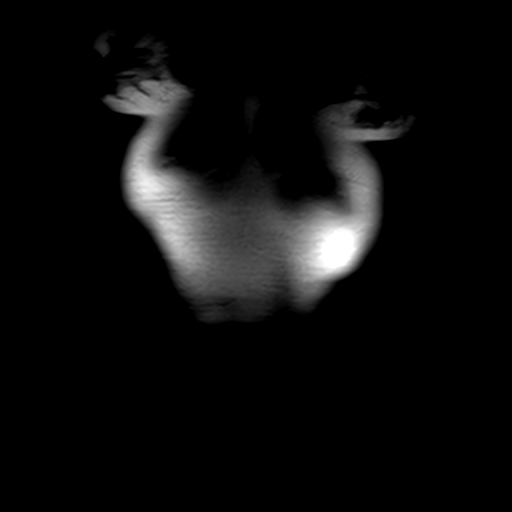

[Series 3: T2 fat-sat · axial · 7.0mm · 0.74mm/px · z∈[-105,+139]mm · 2 of 30 slices shown]
[im 1/30]
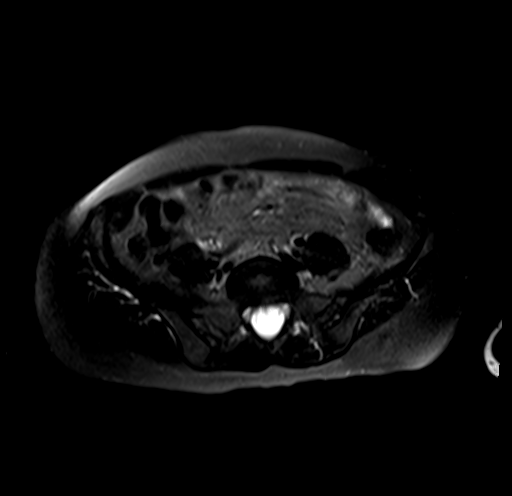
[im 30/30]
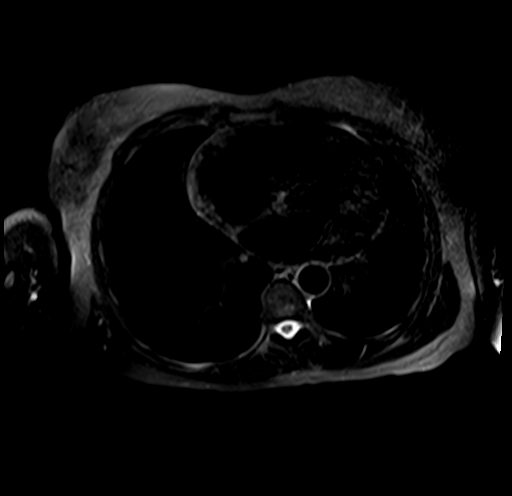

[Series 4: T1 · axial · 7.0mm · 0.74mm/px · z∈[-105,+139]mm · 2 of 30 slices shown]
[im 1/30]
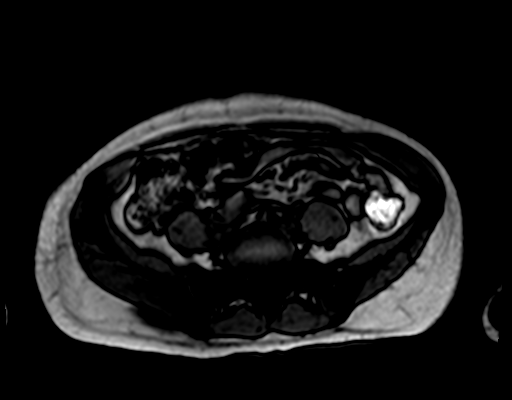
[im 30/30]
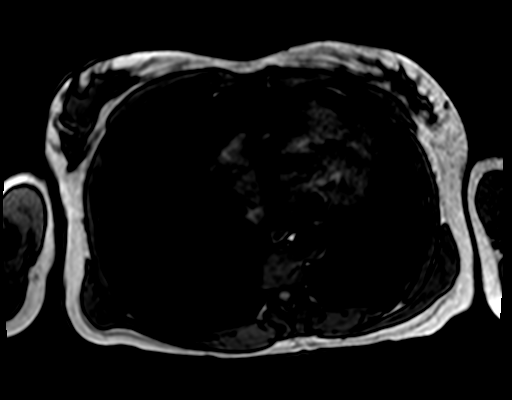

[Series 6: DWI · axial · 7.0mm · 1.98mm/px · z∈[-105,+139]mm · 4 of 90 slices shown]
[im 1/90]
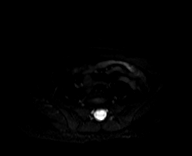
[im 30/90]
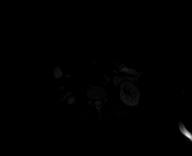
[im 60/90]
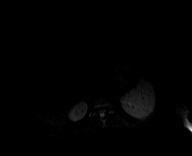
[im 90/90]
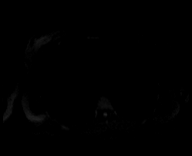

[Series 7: ax dwi_adc · axial · 7.0mm · 1.98mm/px · 1 of 30 slices shown]
[im 1/30]
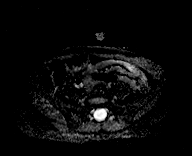

[Series 8: bSSFP · axial · 7.0mm · 0.74mm/px · 1 of 30 slices shown]
[im 1/30]
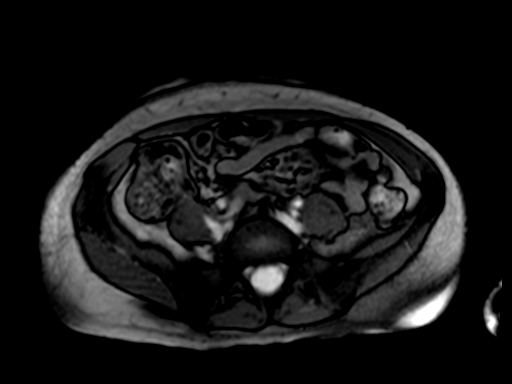

[Series 12: T1 fat-sat · axial · 7.0mm · 1.48mm/px · 1 of 30 slices shown]
[im 1/30]
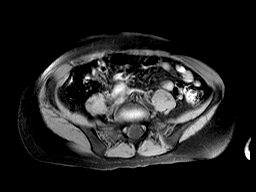

[Series 13: T1 dynamic fat-sat · axial · non-contrast · 2.5mm · 0.74mm/px · z∈[-92,+126]mm · 3 of 88 slices shown]
[im 1/88]
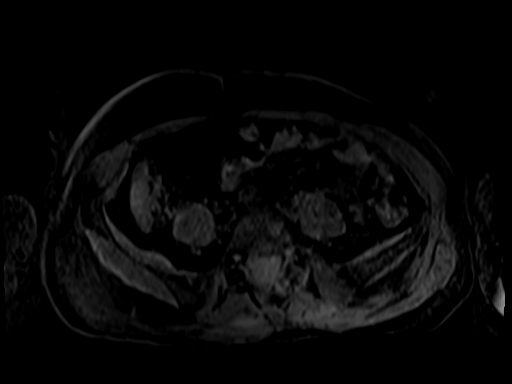
[im 44/88]
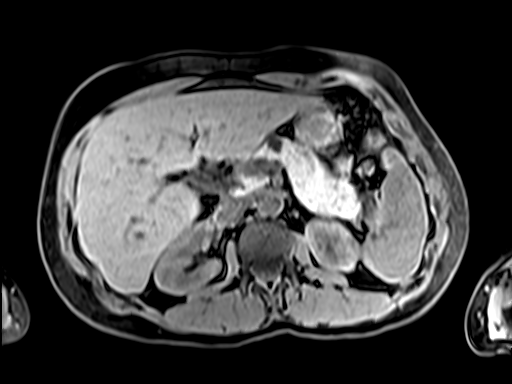
[im 88/88]
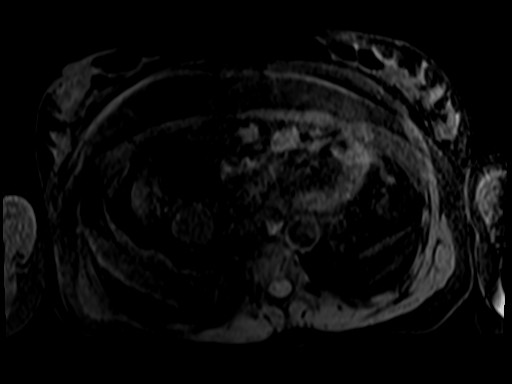

[Series 14: T1 dynamic fat-sat post-contrast · axial · 2.5mm · 0.74mm/px · z∈[-92,+126]mm · 3 of 88 slices shown (1 of 4)]
[im 1/88]
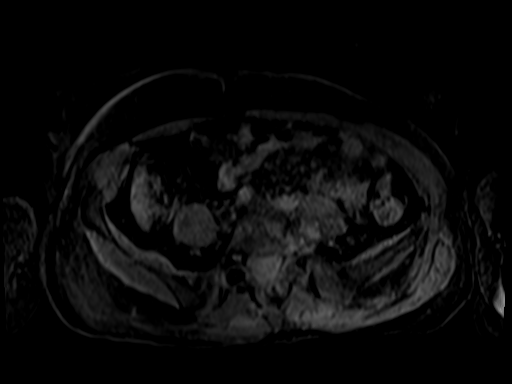
[im 44/88]
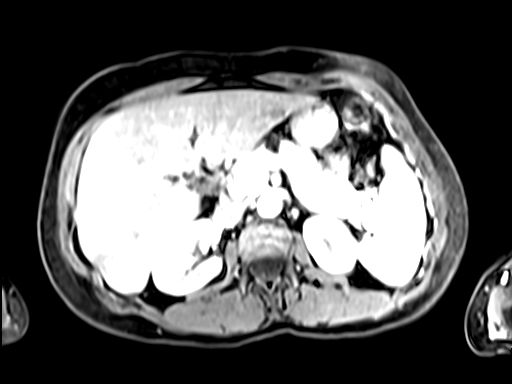
[im 88/88]
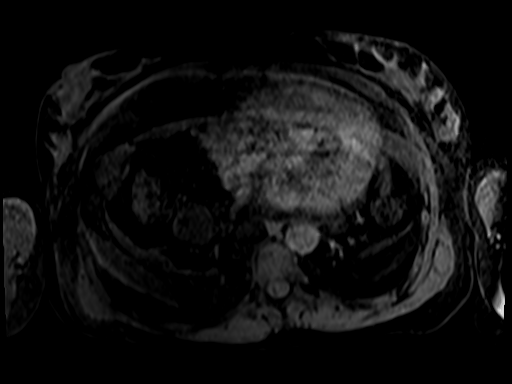

[Series 15: T1 dynamic fat-sat post-contrast · axial · 2.5mm · 0.74mm/px · z∈[-92,+126]mm · 3 of 88 slices shown (2 of 4)]
[im 1/88]
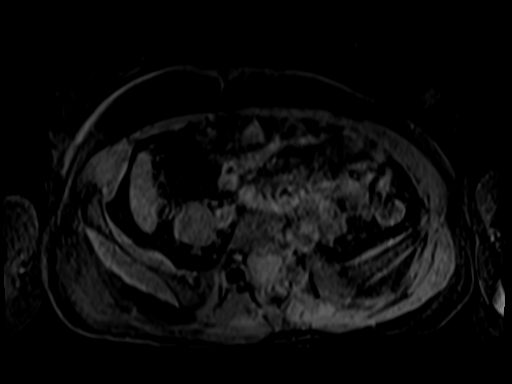
[im 44/88]
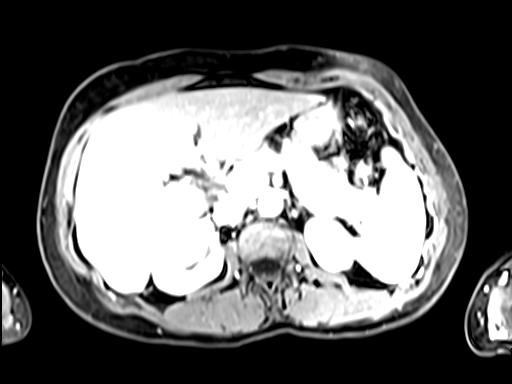
[im 88/88]
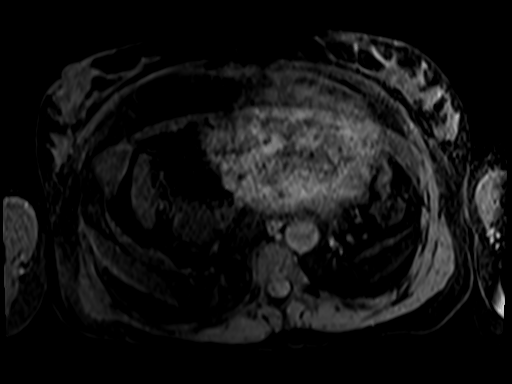

[Series 16: T1 dynamic fat-sat post-contrast · axial · 2.5mm · 0.74mm/px · z∈[-92,+126]mm · 3 of 88 slices shown (3 of 4)]
[im 1/88]
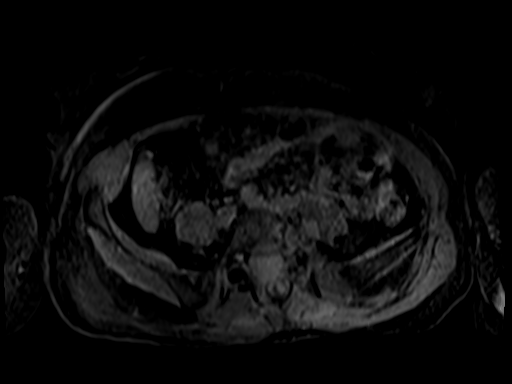
[im 44/88]
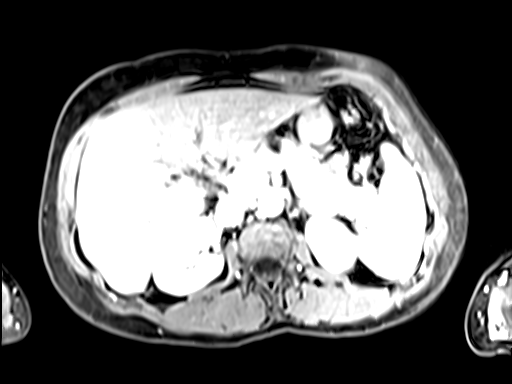
[im 88/88]
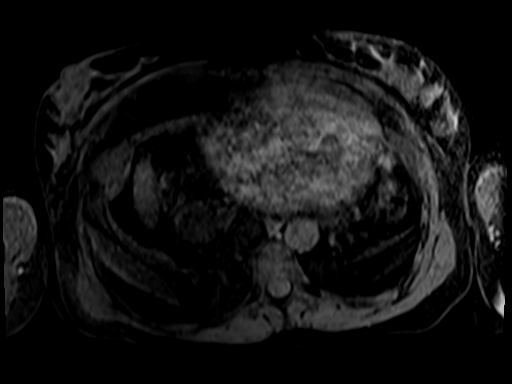

[Series 17: T1 dynamic fat-sat post-contrast · coronal · 2.5mm · 0.82mm/px · 2 of 80 slices shown (4 of 4)]
[im 1/80]
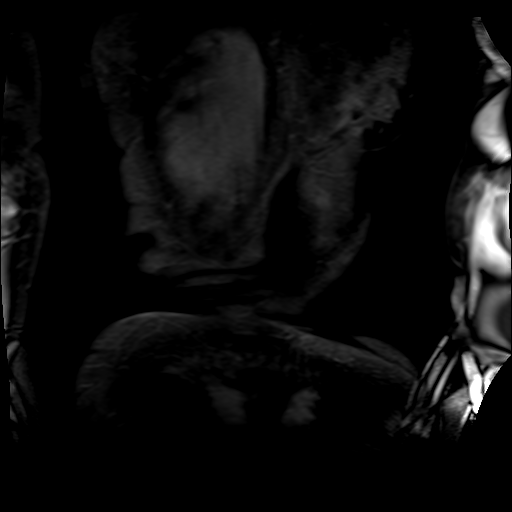
[im 40/80]
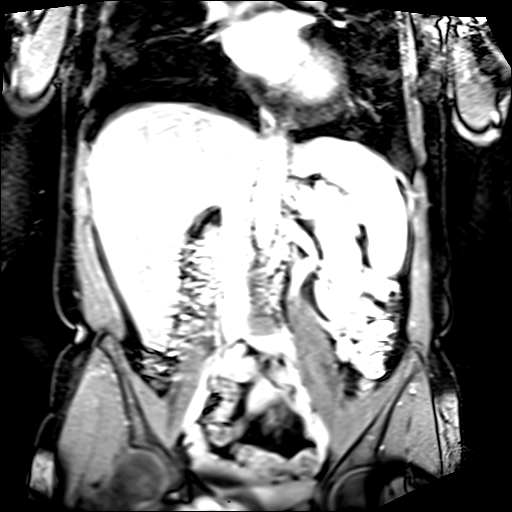

[27 of 48 positions shown; findings below may reference images not displayed]

FINDINGS: Lower chest: Mild cardiomegaly, without pericardial or pleural
effusion.

Hepatobiliary: Central right hepatic lobe tiny cyst. No suspicious
liver lesion. Normal gallbladder, without biliary ductal dilatation.
No evidence of choledocholithiasis.

Pancreas: Within the pancreatic neck/ body junction, a 1.8 x 1.2 by
1.8 cm cystic lesion is identified. Example image 15/series 3 and
image 18/series 2. This may have a minimal complexity within,
including on image 57/series 17. Intimately associated with the main
pancreatic duct, and possibly communicating with.

No main pancreatic duct dilatation or evidence of acute
pancreatitis.

Spleen: Normal in size, without focal abnormality.

Adrenals/Urinary Tract: Normal adrenal glands. Normal kidneys,
without hydronephrosis.

Stomach/Bowel: Normal stomach and abdominal bowel loops.

Vascular/Lymphatic: Normal caliber of the aorta and branch vessels.
No retroperitoneal or retrocrural adenopathy.

Other: No ascites.

Musculoskeletal: No acute osseous abnormality.
IMPRESSION: 1. 18 mm cystic lesion within the junction of the pancreatic neck
and body. Most likely a pseudocyst. Indolent cystic neoplasm,
including intraductal papillary mucinous tumor or oligocystic serous
cystadenoma felt less likely. Per consensus criteria, this warrants
followup with pre and post contrast abdominal MRI at 1 year. This
recommendation follows ACR consensus guidelines: Managing Incidental
Findings on Abdominal CT: White Paper of the ACR Incidental Findings
Committee. [HOSPITAL] [id number];[DATE]
2.  No acute abdominal process.

## 2017-11-03 ENCOUNTER — Ambulatory Visit: Payer: Self-pay | Attending: Oncology

## 2017-11-03 ENCOUNTER — Ambulatory Visit
Admission: RE | Admit: 2017-11-03 | Discharge: 2017-11-03 | Disposition: A | Discharge status: Home / Self Care | Payer: Self-pay | Source: Ambulatory Visit | Attending: Oncology | Admitting: Oncology

## 2017-11-03 VITALS — BP 185/93 | HR 56 | Temp 97.0°F | Resp 18 | Ht 63.0 in | Wt 151.0 lb

## 2017-11-03 DIAGNOSIS — Z0000 Encounter for general adult medical examination without abnormal findings: Secondary | ICD-10-CM

## 2017-11-03 NOTE — Progress Notes (Signed)
Subjective:     Patient ID: Eldridge AbrahamsEdilma Stangler, female   DOB: 10/18/1966, 51 y.o.   MRN: [id number]  HPI   Review of Systems     Objective:   Physical Exam  Pulmonary/Chest: Right breast exhibits no inverted nipple, no mass, no nipple discharge, no skin change and no tenderness. Left breast exhibits no inverted nipple, no mass, no nipple discharge, no skin change and no tenderness. Breasts are asymmetrical.  Right breast smaller than left       Assessment:     51 year old hispanic patient presents for [facility] clinic visit.  Delos HaringLoyda Murr interpreted exam. Patient screened, and meets [facility]CP eligibility.  Patient does not have insurance, Medicare or Medicaid.  Handout given on Affordable Care Act. Instructed patient on breast self-exam using teach back method.  CBE unremarkable.  No mass or lump.  Previous mammogram 2012    Plan:     Sent for bilateral screening mammogram.

## 2017-11-05 NOTE — Progress Notes (Signed)
Letter mailed from [facility] to notify of normal mammogram results.  Patient to return in one year for annual screening.  Copy to [facility]. 

## 2018-02-05 IMAGING — US US ABDOMEN LIMITED
1 series · 14 of 25 positions shown · non-contrast
Comparison: None

CLINICAL DATA: Abdominal pain for 3 weeks, no nausea or vomiting

EXAM:
US ABDOMEN LIMITED - RIGHT UPPER QUADRANT

[Series 1: us abdomen limited · 0.20mm/px · 14 of 61 slices shown]
[im 1/61]
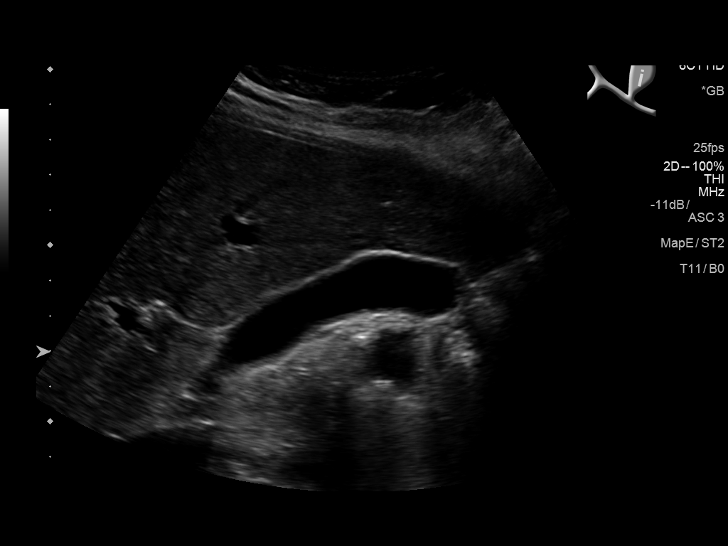
[im 6/61]
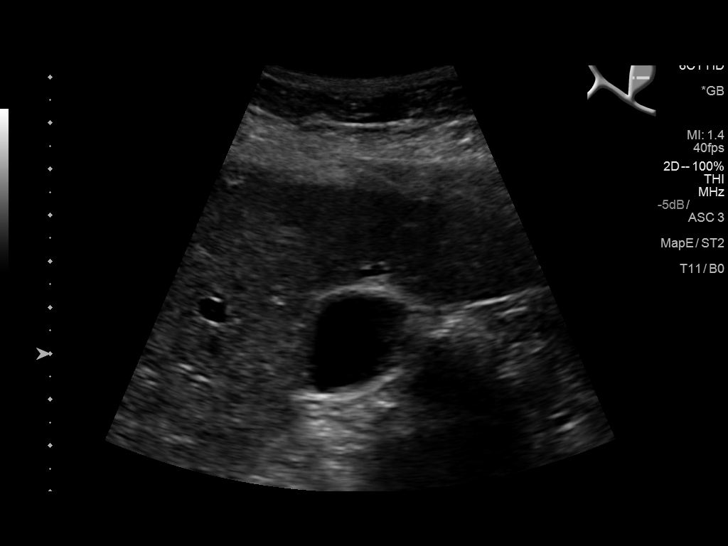
[im 11/61]
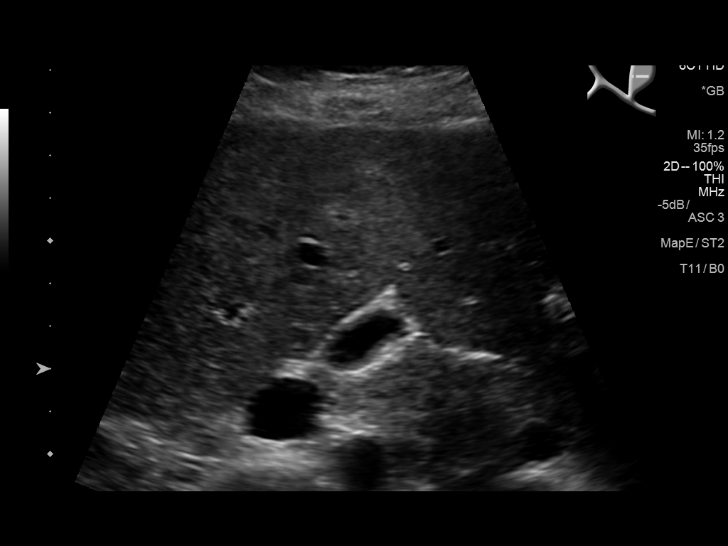
[im 16/61]
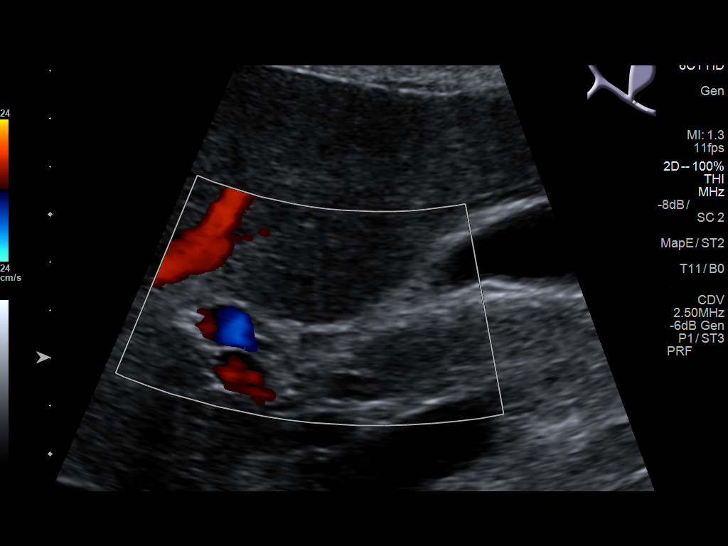
[im 21/61]
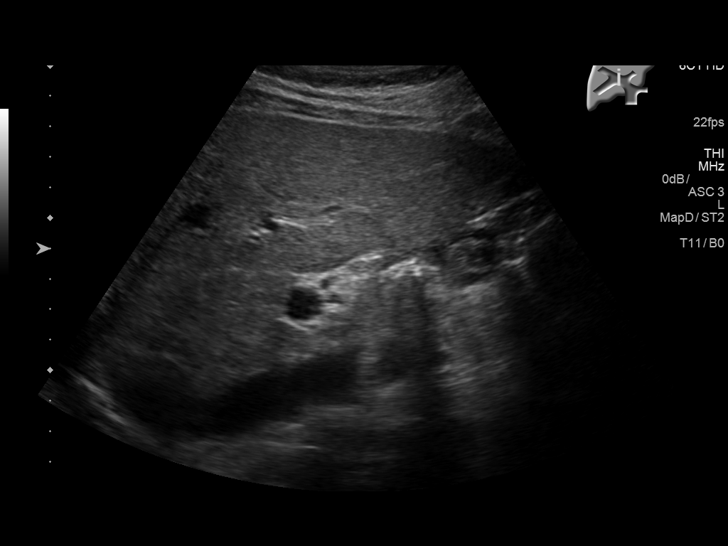
[im 23/61]
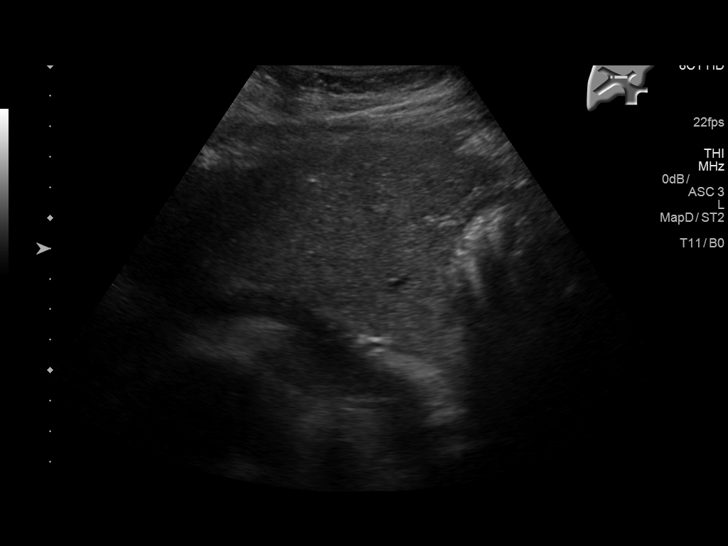
[im 28/61]
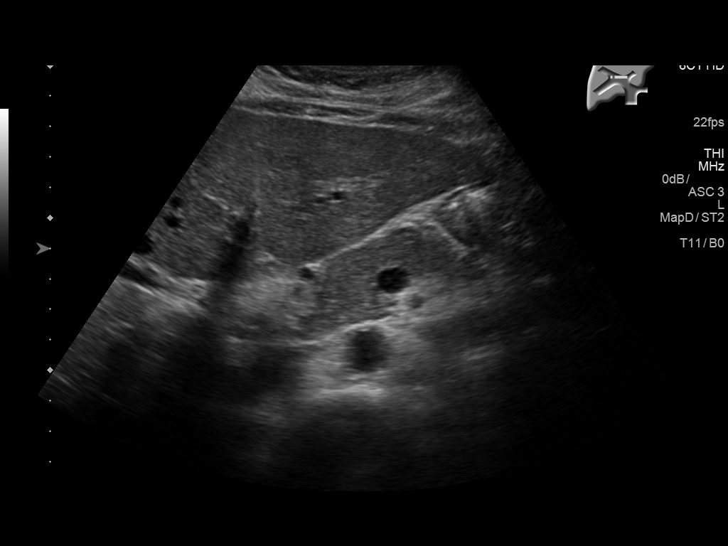
[im 33/61]
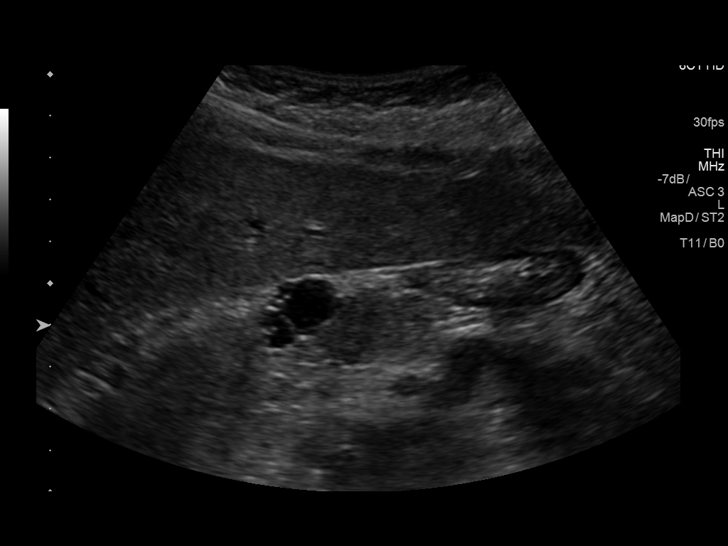
[im 38/61]
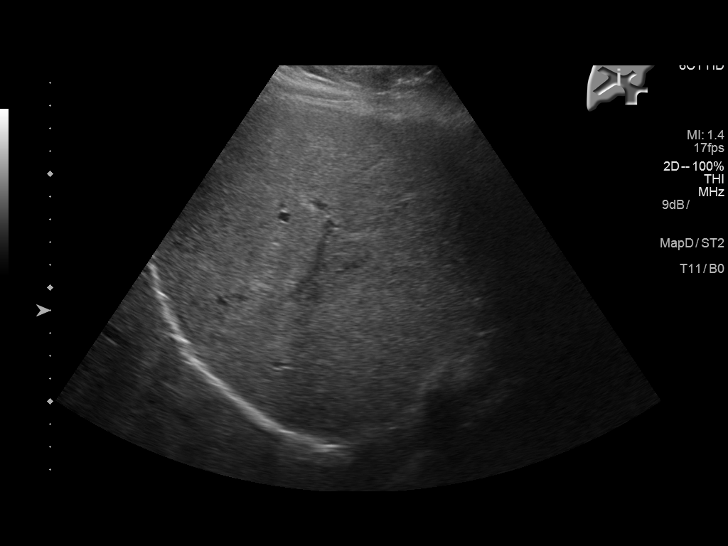
[im 41/61]
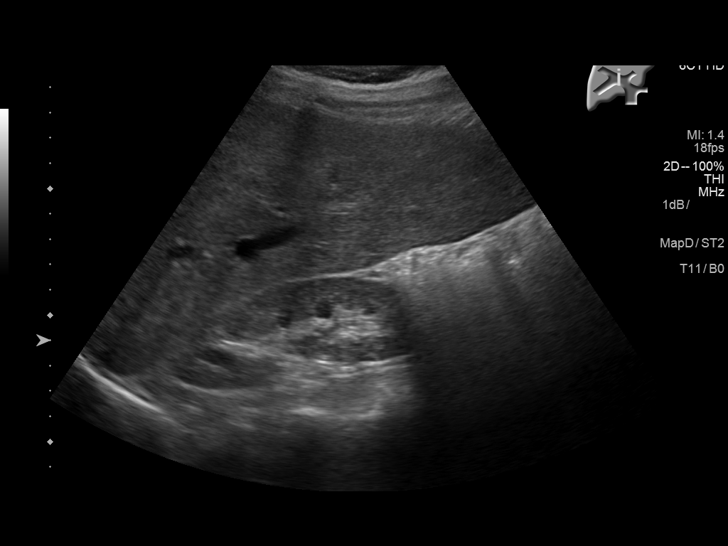
[im 46/61]
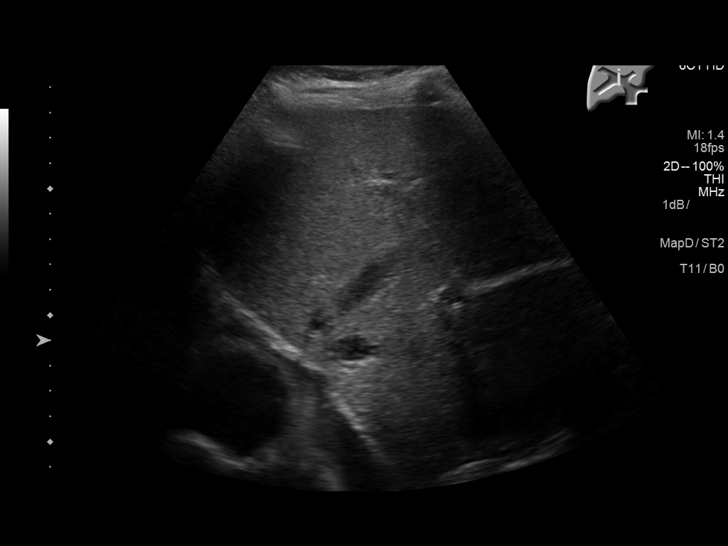
[im 51/61]
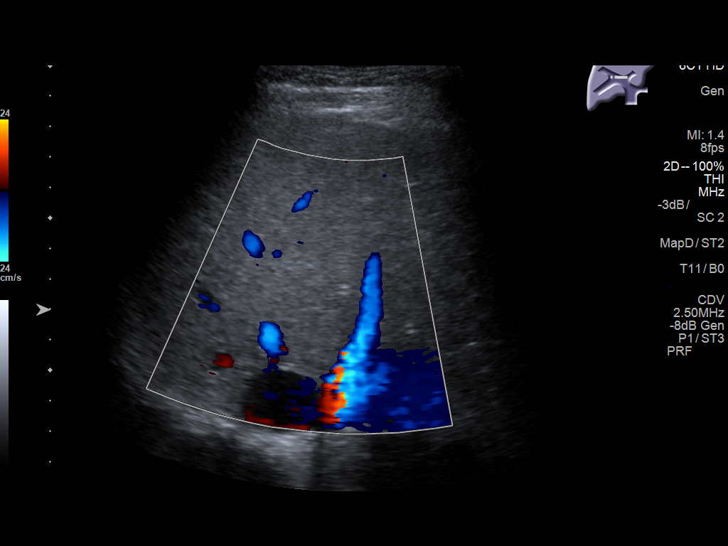
[im 56/61]
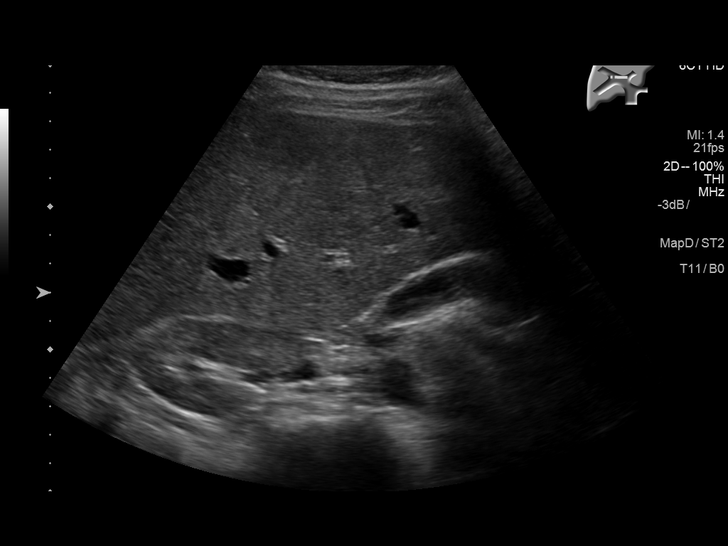
[im 61/61]
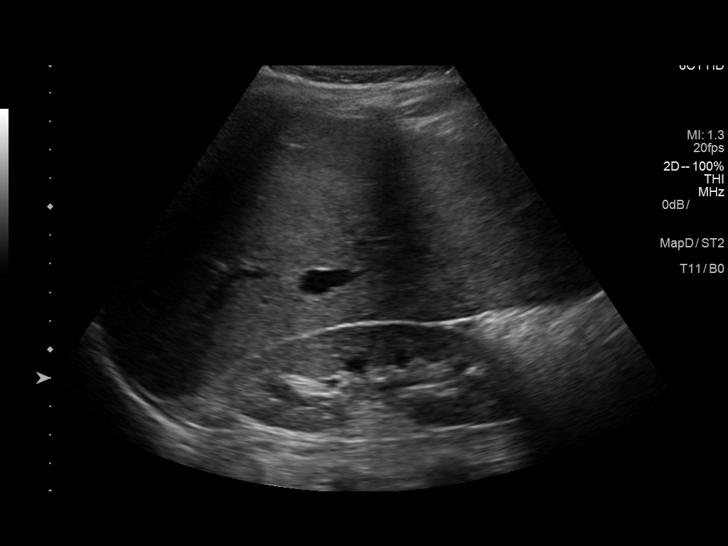

[14 of 25 positions shown; findings below may reference images not displayed]

FINDINGS: Gallbladder:

Normally distended without stones or wall thickening.

No pericholecystic fluid or sonographic Murphy sign.

Common bile duct:

Diameter: Normal caliber 2 mm diameter

Liver:

Normal echogenicity without mass.  Hepatopetal portal venous flow.

No RIGHT upper quadrant free fluid.

Question cystic lesion at pancreatic head, septated, 2.2 x 1.6 x
cm.
IMPRESSION: No hepatobiliary abnormalities identified.

Suspected septated cystic lesion at pancreatic head 2.2 x 1.6 x
cm; followup characterization by MR imaging with and without
contrast recommended.

## 2018-02-06 IMAGING — US US PELVIS COMPLETE
1 series · 13 of 25 positions shown · non-contrast
Comparison: None

CLINICAL DATA: Heavy menses for 1 month. Gravida 4 para 3. LMP
02/07/2016.

EXAM:
TRANSABDOMINAL AND TRANSVAGINAL ULTRASOUND OF PELVIS
TECHNIQUE: Both transabdominal and transvaginal ultrasound examinations of the
pelvis were performed. Transabdominal technique was performed for
global imaging of the pelvis including uterus, ovaries, adnexal
regions, and pelvic cul-de-sac. It was necessary to proceed with
endovaginal exam following the transabdominal exam to visualize the
endometrium, ovaries.

[Series 1: us pelvis complete · 0.24mm/px · 13 of 128 slices shown]
[im 1/128]
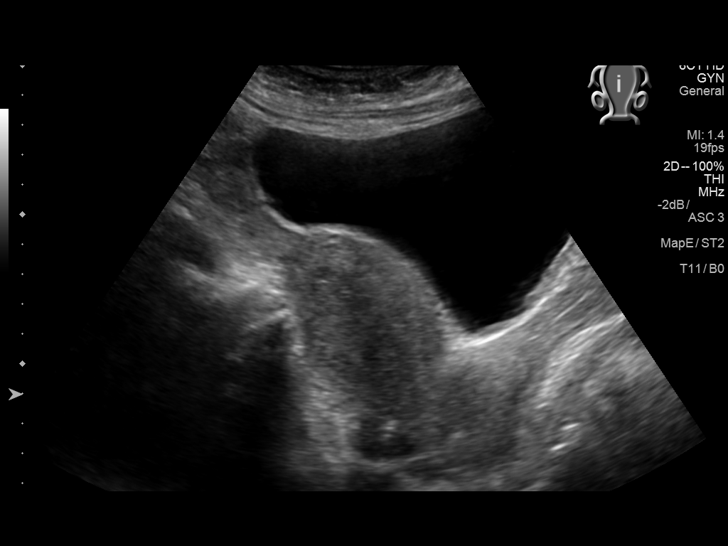
[im 11/128]
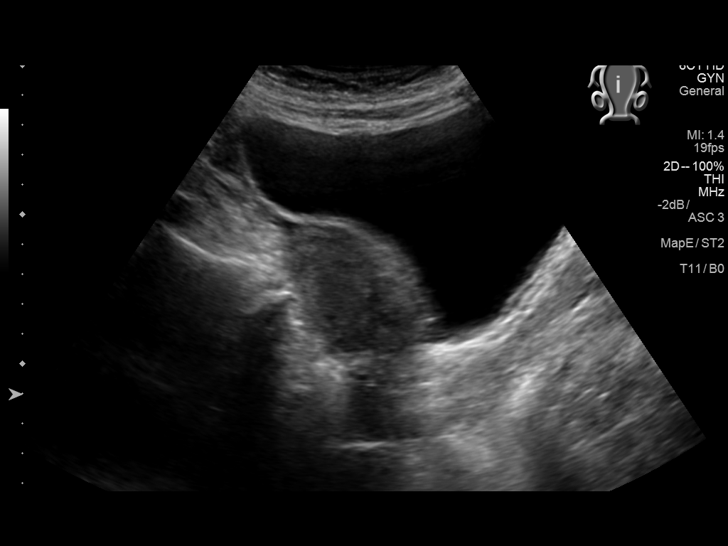
[im 22/128]
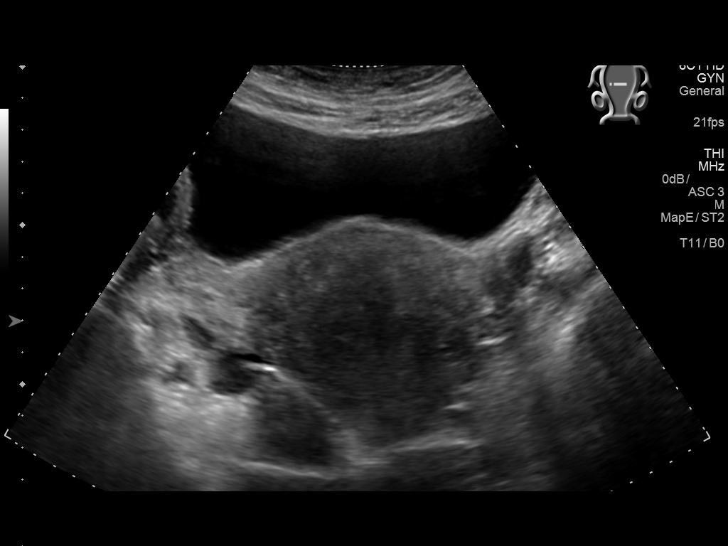
[im 32/128]
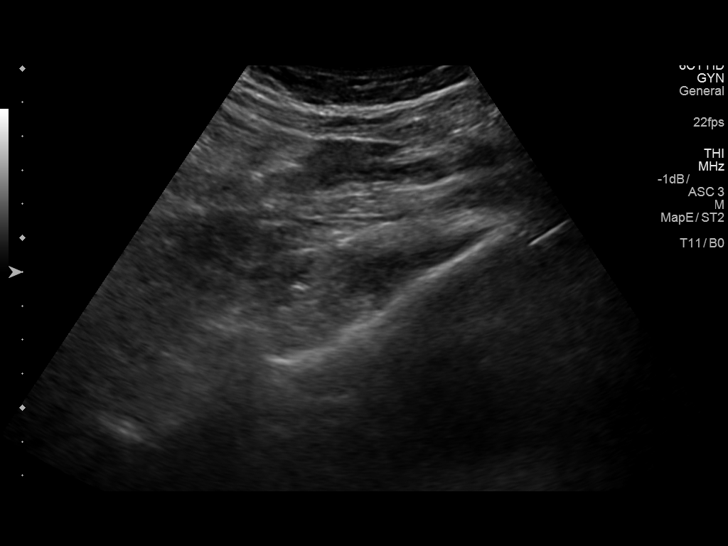
[im 43/128]
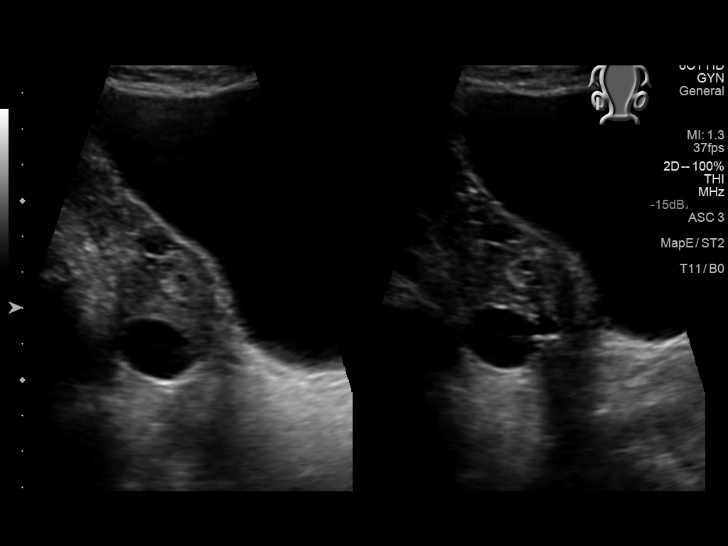
[im 53/128]
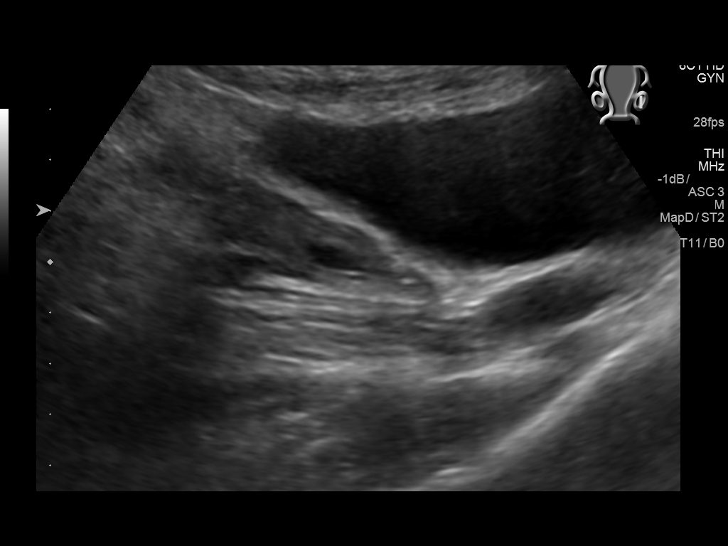
[im 64/128]
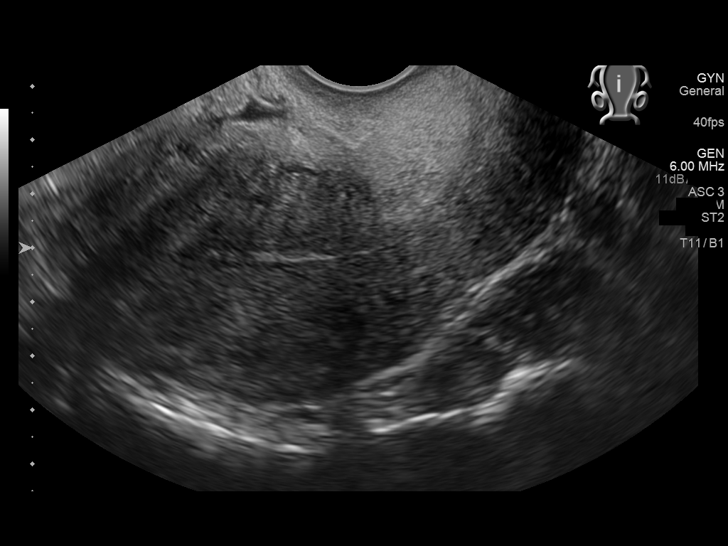
[im 75/128]
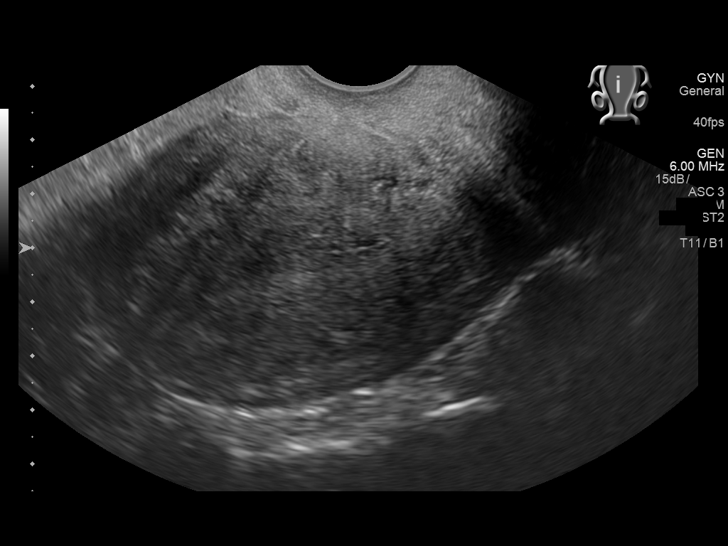
[im 85/128]
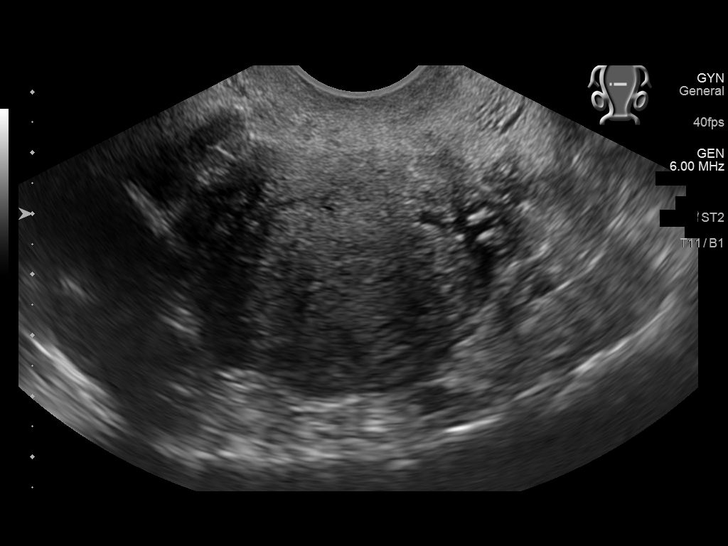
[im 96/128]
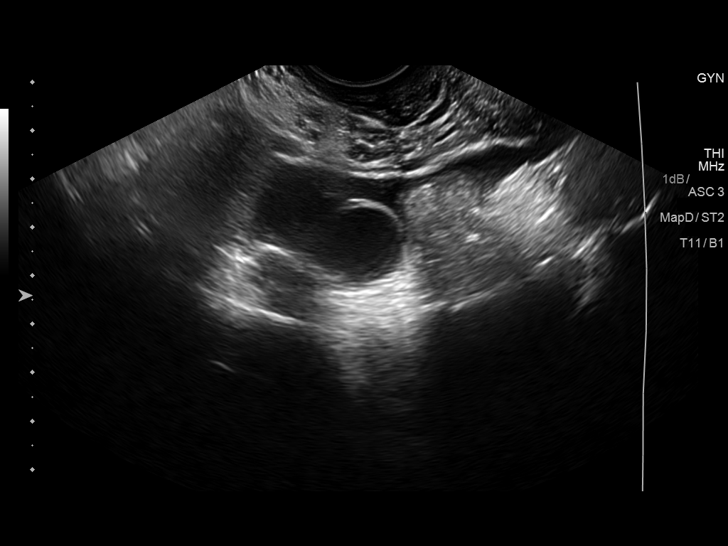
[im 106/128]
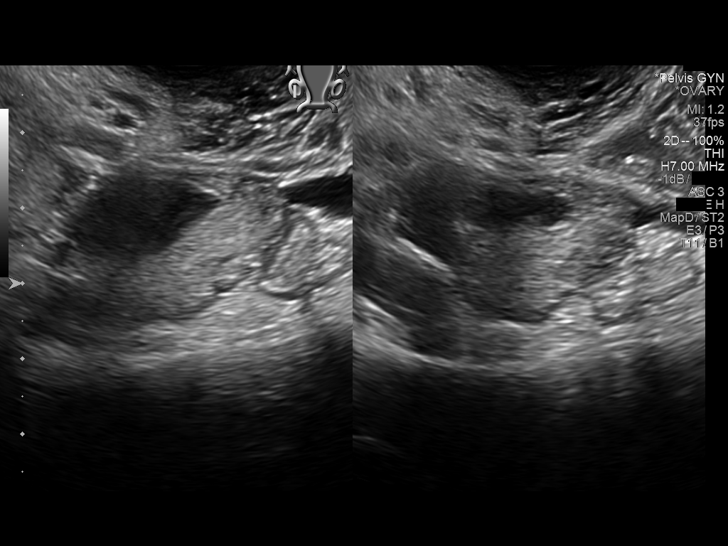
[im 117/128]
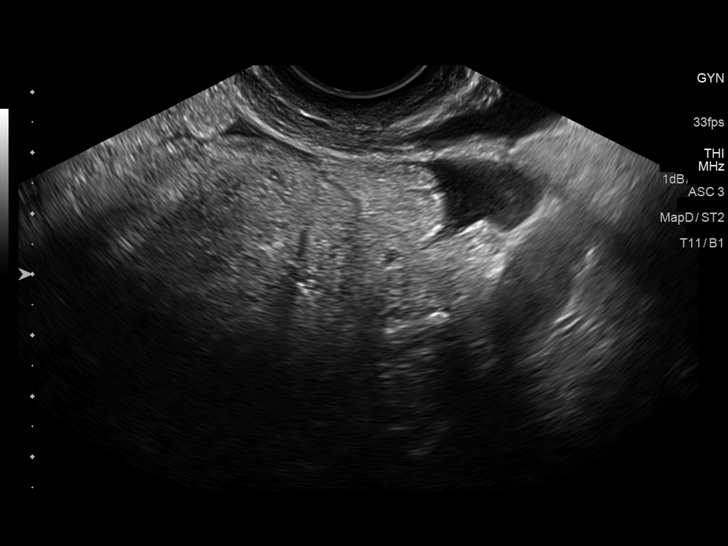
[im 128/128]
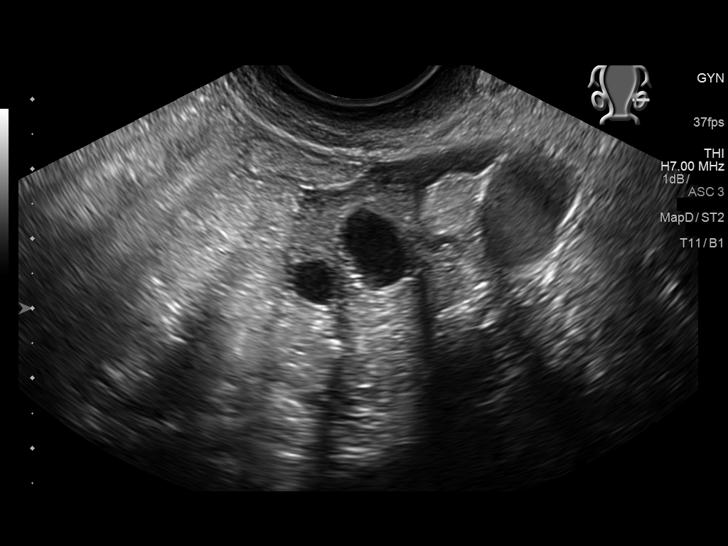

[13 of 25 positions shown; findings below may reference images not displayed]

FINDINGS: Uterus

Measurements: 10.0 x 5.3 x 6.9 cm. No fibroids or other mass
visualized.

Endometrium

Thickness: 4.0 mm.  No focal abnormality visualized.

Right ovary

Measurements: 2.5 x 2.5 x 2.3 cm. Normal appearance/no adnexal mass.
Dominant follicle is 1.8 cm.

Left ovary

Measurements: 2.7 x 1.6 x 1.9 cm. Normal appearance/no adnexal mass.

Other findings

Small amount of free pelvic fluid is present.
IMPRESSION: 1. Normal appearance of the uterus and endometrium.
2. Normal appearance of the endometrial stripe. If bleeding remains
unresponsive to hormonal or medical therapy, sonohysterogram should
be considered for focal lesion work-up. (Ref: Radiological
Reasoning: Algorithmic Workup of Abnormal Vaginal Bleeding with
Endovaginal Sonography and Sonohysterography. AJR [id number]; 191:S68-73)
3. Small amount of free pelvic fluid likely physiologic.

## 2019-04-26 ENCOUNTER — Ambulatory Visit: Payer: Self-pay

## 2019-10-18 ENCOUNTER — Other Ambulatory Visit: Payer: Self-pay

## 2019-10-18 DIAGNOSIS — Z20822 Contact with and (suspected) exposure to covid-19: Secondary | ICD-10-CM

## 2019-10-20 LAB — NOVEL CORONAVIRUS, NAA: SARS-CoV-2, NAA: DETECTED — AB

## 2020-02-23 ENCOUNTER — Ambulatory Visit: Payer: Self-pay | Attending: Internal Medicine

## 2020-02-23 DIAGNOSIS — Z23 Encounter for immunization: Secondary | ICD-10-CM

## 2020-02-23 NOTE — Progress Notes (Signed)
   Covid-19 Vaccination Clinic  Name:  Lindsay Bates    MRN: [id number] DOB: Oct 09, 1966  02/23/2020  Lindsay Bates was observed post Covid-19 immunization for 15 minutes without incident. She was provided with Vaccine Information Sheet and instruction to access the V-Safe system.   Lindsay Bates was instructed to call 911 with any severe reactions post vaccine: Marland Kitchen Difficulty breathing  . Swelling of face and throat  . A fast heartbeat  . A bad rash all over body  . Dizziness and weakness   Immunizations Administered    Name Date Dose VIS Date Route   Pfizer COVID-19 Vaccine 02/23/2020  5:07 PM 0.3 mL 11/10/2019 Intramuscular   Manufacturer: [employer], [employer]   Lot: [id number]   NDC: [address]-6

## 2020-02-24 ENCOUNTER — Ambulatory Visit: Payer: Self-pay

## 2020-03-15 ENCOUNTER — Ambulatory Visit: Payer: Self-pay | Attending: Internal Medicine

## 2020-03-15 DIAGNOSIS — Z23 Encounter for immunization: Secondary | ICD-10-CM

## 2020-03-15 NOTE — Progress Notes (Signed)
   Covid-19 Vaccination Clinic  Name:  Lindsay Bates    MRN: [id number] DOB: 07-Mar-1966  03/15/2020  [name] was observed post Covid-19 immunization for 15 minutes without incident. She was provided with Vaccine Information Sheet and instruction to access the V-Safe system.   [name] was instructed to call 911 with any severe reactions post vaccine: Marland Kitchen Difficulty breathing  . Swelling of face and throat  . A fast heartbeat  . A bad rash all over body  . Dizziness and weakness   Immunizations Administered    Name Date Dose VIS Date Route   Pfizer COVID-19 Vaccine 03/15/2020  4:55 PM 0.3 mL 11/10/2019 Intramuscular   Manufacturer: [employer], [employer]   Lot: [id number]   NDC: [address]-0

## 2021-06-25 ENCOUNTER — Other Ambulatory Visit: Payer: Self-pay

## 2021-06-25 ENCOUNTER — Ambulatory Visit: Payer: Self-pay | Attending: Oncology

## 2021-06-25 ENCOUNTER — Ambulatory Visit
Admission: RE | Admit: 2021-06-25 | Discharge: 2021-06-25 | Disposition: A | Discharge status: Home / Self Care | Payer: Self-pay | Source: Ambulatory Visit | Attending: Oncology | Admitting: Oncology

## 2021-06-25 DIAGNOSIS — Z0000 Encounter for general adult medical examination without abnormal findings: Secondary | ICD-10-CM

## 2021-06-25 NOTE — Progress Notes (Signed)
  Subjective:     Patient ID: Lindsay Bates, female   DOB: 06-30-1966, 56 y.o.   MRN: [id number]  HPI   Review of Systems     Objective:   Physical Exam     Assessment:     Patient pre-screened for BCCCP eligibility due to COVID 19 precautions. Two patient identifiers used for verification that I was speaking to correct patient.    Claretha Cooper Murr interpreted interview.  Plan:     Patient to Present directly to [facility] today for BCCCP screening mammogram.

## 2021-07-17 NOTE — Progress Notes (Signed)
Letter mailed from [facility] to notify of normal mammogram results.  Patient to return in one year for annual screening.  Copy to [facility]. 

## 2021-08-11 ENCOUNTER — Emergency Department: Payer: Self-pay

## 2021-08-11 ENCOUNTER — Other Ambulatory Visit: Payer: Self-pay

## 2021-08-11 ENCOUNTER — Emergency Department
Admission: EM | Admit: 2021-08-11 | Discharge: 2021-08-11 | Disposition: A | Discharge status: Home / Self Care | Payer: Self-pay | Attending: Emergency Medicine | Admitting: Emergency Medicine

## 2021-08-11 DIAGNOSIS — R110 Nausea: Secondary | ICD-10-CM | POA: Insufficient documentation

## 2021-08-11 DIAGNOSIS — R1010 Upper abdominal pain, unspecified: Secondary | ICD-10-CM

## 2021-08-11 DIAGNOSIS — R519 Headache, unspecified: Secondary | ICD-10-CM | POA: Insufficient documentation

## 2021-08-11 DIAGNOSIS — Z20822 Contact with and (suspected) exposure to covid-19: Secondary | ICD-10-CM | POA: Insufficient documentation

## 2021-08-11 DIAGNOSIS — R1011 Right upper quadrant pain: Secondary | ICD-10-CM | POA: Insufficient documentation

## 2021-08-11 LAB — URINALYSIS, COMPLETE (UACMP) WITH MICROSCOPIC
Bilirubin Urine: NEGATIVE
Glucose, UA: NEGATIVE mg/dL
Ketones, ur: NEGATIVE mg/dL
Leukocytes,Ua: NEGATIVE
Nitrite: NEGATIVE
Protein, ur: 100 mg/dL — AB
Specific Gravity, Urine: 1.008 (ref 1.005–1.030)
pH: 7 (ref 5.0–8.0)

## 2021-08-11 LAB — RESP PANEL BY RT-PCR (FLU A&B, COVID) ARPGX2
Influenza A by PCR: NEGATIVE
Influenza B by PCR: NEGATIVE
SARS Coronavirus 2 by RT PCR: NEGATIVE

## 2021-08-11 LAB — LIPASE, BLOOD: Lipase: 62 U/L — ABNORMAL HIGH (ref 11–51)

## 2021-08-11 LAB — COMPREHENSIVE METABOLIC PANEL
ALT: 20 U/L (ref 0–44)
AST: 23 U/L (ref 15–41)
Albumin: 4.2 g/dL (ref 3.5–5.0)
Alkaline Phosphatase: 55 U/L (ref 38–126)
Anion gap: 12 (ref 5–15)
BUN: 47 mg/dL — ABNORMAL HIGH (ref 6–20)
CO2: 21 mmol/L — ABNORMAL LOW (ref 22–32)
Calcium: 9.3 mg/dL (ref 8.9–10.3)
Chloride: 104 mmol/L (ref 98–111)
Creatinine, Ser: 2.05 mg/dL — ABNORMAL HIGH (ref 0.44–1.00)
GFR, Estimated: 28 mL/min — ABNORMAL LOW (ref >60)
Glucose, Bld: 99 mg/dL (ref 70–99)
Potassium: 3.4 mmol/L — ABNORMAL LOW (ref 3.5–5.1)
Sodium: 137 mmol/L (ref 135–145)
Total Bilirubin: 1.2 mg/dL (ref 0.3–1.2)
Total Protein: 7.2 g/dL (ref 6.5–8.1)

## 2021-08-11 LAB — POC URINE PREG, ED: Preg Test, Ur: NEGATIVE

## 2021-08-11 LAB — CBC
HCT: 34.7 % — ABNORMAL LOW (ref 36.0–46.0)
Hemoglobin: 12.3 g/dL (ref 12.0–15.0)
MCH: 29.4 pg (ref 26.0–34.0)
MCHC: 35.4 g/dL (ref 30.0–36.0)
MCV: 82.8 fL (ref 80.0–100.0)
Platelets: 224 10*3/uL (ref 150–400)
RBC: 4.19 MIL/uL (ref 3.87–5.11)
RDW: 12.7 % (ref 11.5–15.5)
WBC: 11 10*3/uL — ABNORMAL HIGH (ref 4.0–10.5)
nRBC: 0 % (ref 0.0–0.2)

## 2021-08-11 MED ORDER — KETOROLAC TROMETHAMINE 30 MG/ML IJ SOLN
15.0000 mg | Freq: Once | INTRAMUSCULAR | Status: AC
  Administered 2021-08-11: 15 mg via INTRAVENOUS
  Filled 2021-08-11: qty 1

## 2021-08-11 MED ORDER — KETOROLAC TROMETHAMINE 30 MG/ML IJ SOLN: 30.0000 mg | Freq: Once | INTRAMUSCULAR | Status: DC

## 2021-08-11 MED ORDER — SODIUM CHLORIDE 0.9 % IV BOLUS
1000.0000 mL | Freq: Once | INTRAVENOUS | Status: AC
  Administered 2021-08-11: 1000 mL via INTRAVENOUS

## 2021-08-11 MED ORDER — METOCLOPRAMIDE HCL 5 MG/ML IJ SOLN
10.0000 mg | Freq: Once | INTRAMUSCULAR | Status: AC
  Administered 2021-08-11: 10 mg via INTRAVENOUS
  Filled 2021-08-11: qty 2

## 2021-08-11 MED ORDER — DIPHENHYDRAMINE HCL 50 MG/ML IJ SOLN
50.0000 mg | Freq: Once | INTRAMUSCULAR | Status: AC
  Administered 2021-08-11: 50 mg via INTRAVENOUS
  Filled 2021-08-11: qty 1

## 2021-08-11 NOTE — ED Notes (Signed)
ED Provider at bedside with interpreter  

## 2021-08-11 NOTE — ED Triage Notes (Signed)
Pt comes into the ED via EMS from work with c/o sudden onset abd pain  #[id number] Zofran 55m Fentanyl CBG119 200/110

## 2021-08-11 NOTE — ED Provider Notes (Signed)
[facility] Emergency Department Provider Note  Time seen: 1:00 PM  I have reviewed the triage vital signs and the nursing notes. Spanish interpreter used for this evaluation.  HISTORY  Chief Complaint Abdominal Pain and Headache   HPI Lindsay Bates is a 54 y.o. female with a past medical history of anemia, presents to the emergency department for abdominal pain and headache.  According to the patient since this morning she has been experiencing pain across the upper abdomen as well as nausea and a headache.  Patient denies any fever cough or chest pain.  No vomiting diarrhea or dysuria.  Patient describes the upper abdominal pain as mild, headache is moderate.  Denies alcohol use.  Patient does still have her gallbladder.   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    Past Surgical History:  Procedure Laterality Date   COLONOSCOPY WITH PROPOFOL N/A 03/16/2016   Procedure: COLONOSCOPY WITH PROPOFOL;  Surgeon: Midge Minium, MD;  Location: [facility];  Service: Endoscopy;  Laterality: N/A;   ESOPHAGOGASTRODUODENOSCOPY (EGD) WITH PROPOFOL N/A 03/16/2016   Procedure: ESOPHAGOGASTRODUODENOSCOPY (EGD) WITH PROPOFOL;  Surgeon: Midge Minium, MD;  Location: [facility];  Service: Endoscopy;  Laterality: N/A;  Needs interpreter   EUS N/A 04/23/2016   Procedure: UPPER ENDOSCOPIC ULTRASOUND (EUS) LINEAR;  Surgeon: Bearl Mulberry, MD;  Location: [facility];  Service: Gastroenterology;  Laterality: N/A;   TUBAL LIGATION      Prior to Admission  medications   Medication Sig Start Date End Date Taking? Authorizing Provider  acetaminophen (TYLENOL) 325 MG tablet Take 650 mg by mouth every 6 (six) hours as needed. Reported on 05/25/2016    [provider]  ferrous sulfate (IRON SUPPLEMENT) 325 (65 FE) MG tablet Take 1 tablet (325 mg total) by mouth 3 (three) times daily with meals. 02/13/16   Auburn Bilberry, MD  Multiple Vitamins-Iron (MULTIVITAMINS WITH IRON) TABS tablet Take 1 tablet by mouth daily. 02/13/16   Auburn Bilberry, MD    No Known Allergies  Family History  Problem Relation Age of Onset   Alcohol abuse Neg Hx    Arthritis Neg Hx    Asthma Neg Hx    Birth defects Neg Hx    Cancer Neg Hx    COPD Neg Hx    Depression Neg Hx    Diabetes Neg Hx    Drug abuse Neg Hx    Early death Neg Hx    Hearing loss Neg Hx    Heart disease Neg Hx    Hyperlipidemia Neg Hx    Hypertension Neg Hx    Kidney disease Neg Hx    Learning disabilities Neg Hx    Mental illness Neg Hx    Mental retardation Neg Hx    Miscarriages / Stillbirths Neg Hx    Stroke Neg Hx    Vision loss Neg Hx    Varicose Veins Neg Hx    Breast cancer Neg Hx     Social History Social  History   Tobacco Use   Smoking status: Never   Smokeless tobacco: Never  Substance Use Topics   Alcohol use: No    Alcohol/week: 0.0 standard drinks   Drug use: No    Review of Systems Constitutional: Negative for fever. Cardiovascular: Negative for chest pain. Respiratory: Negative for shortness of breath. Gastrointestinal: Upper abdominal pain, mild, dull.  Positive for nausea.  Negative for vomiting or diarrhea Genitourinary: Negative for urinary compaints Musculoskeletal: Negative for musculoskeletal complaints Skin: Rosacea to her face, chronic. Neurological: Negative for headache All other ROS negative  ____________________________________________   PHYSICAL EXAM:  VITAL SIGNS: ED Triage Vitals  Enc Vitals Group     BP 08/11/21 1043 (!)  198/86     Pulse Rate 08/11/21 1043 (!) 52     Resp 08/11/21 1043 20     Temp 08/11/21 1043 98.6 F (37 C)     Temp Source 08/11/21 1043 Oral     SpO2 08/11/21 1043 100 %     Weight 08/11/21 1046 156 lb (70.8 kg)     Height 08/11/21 1046 5\' 2"  (1.575 m)     Head Circumference --      Peak Flow --      Pain Score 08/11/21 1046 10     Pain Loc --      Pain Edu? --      Excl. in GC? --    Constitutional: Alert, well appearing and in no distress. Eyes: Normal exam ENT      Head: Normocephalic and atraumatic.      Mouth/Throat: Mucous membranes are moist. Cardiovascular: Normal rate, regular rhythm.  Respiratory: Normal respiratory effort without tachypnea nor retractions. Breath sounds are clear  Gastrointestinal: Soft, mild upper abdominal tenderness palpation without rebound guarding or distention. Musculoskeletal: Nontender with normal range of motion in all extremities. Neurologic:  Normal speech and language. No gross focal neurologic deficits  Skin:  Skin is warm, dry and intact.  Psychiatric: Mood and affect are normal. Speech and behavior are normal.   ____________________________________________    EKG  EKG viewed and interpreted by myself shows sinus bradycardia 53 bpm with a narrow QRS, normal axis, normal intervals, no concerning ST changes.  ____________________________________________    RADIOLOGY  Ultrasound negative for acute abnormality.  ____________________________________________   INITIAL IMPRESSION / ASSESSMENT AND PLAN / ED COURSE  Pertinent labs & imaging results that were available during my care of the patient were reviewed by me and considered in my medical decision making (see chart for details).   Patient presents emergency department for upper abdominal discomfort nausea and a headache since this morning.  Overall the patient appears well.  Lab work shows renal insufficiency however possibly chronic given no recent labs.  Lipase is elevated  at 62 patient does not drink alcohol.  LFTs are otherwise normal however given the patient's mild tenderness over the right upper quadrant epigastrium we will obtain a right upper quadrant ultrasound as a precaution.  We will obtain a COVID swab and treat the patient's headache with Toradol Reglan Benadryl and IV fluids.  Patient agreeable to plan of care.  Patient is feeling much better.  Patient's ultrasound shows no significant abnormality.  Overall the patient appears well.  We will discharge patient home.  I discussed dietary precautions avoiding fatty/oily/greasy foods for the next 2 weeks as well as following up with her doctor.  I also discussed return precautions.  Lindsay Bates was evaluated in Emergency Department on 08/11/2021 for the  symptoms described in the history of present illness. She was evaluated in the context of the global COVID-19 pandemic, which necessitated consideration that the patient might be at risk for infection with the SARS-CoV-2 virus that causes COVID-19. Institutional protocols and algorithms that pertain to the evaluation of patients at risk for COVID-19 are in a state of rapid change based on information released by regulatory bodies including the CDC and federal and state organizations. These policies and algorithms were followed during the patient's care in the ED.  ____________________________________________   FINAL CLINICAL IMPRESSION(S) / ED DIAGNOSES  Abdominal pain Headache   Minna Antis, MD 08/11/21 1556

## 2021-08-11 NOTE — ED Triage Notes (Signed)
Interpreter # [phone] used for triage Pt to ED EMS for headache , nausea, and upper abd pain that started this am

## 2022-09-30 ENCOUNTER — Other Ambulatory Visit: Payer: Self-pay

## 2022-09-30 DIAGNOSIS — Z1231 Encounter for screening mammogram for malignant neoplasm of breast: Secondary | ICD-10-CM

## 2022-10-05 ENCOUNTER — Ambulatory Visit: Payer: Self-pay | Attending: Hematology and Oncology | Admitting: Hematology and Oncology

## 2022-10-05 ENCOUNTER — Ambulatory Visit
Admission: RE | Admit: 2022-10-05 | Discharge: 2022-10-05 | Disposition: A | Discharge status: Home / Self Care | Payer: Self-pay | Source: Ambulatory Visit | Attending: Obstetrics and Gynecology | Admitting: Obstetrics and Gynecology

## 2022-10-05 VITALS — BP 164/95 | Wt 145.9 lb

## 2022-10-05 DIAGNOSIS — Z1231 Encounter for screening mammogram for malignant neoplasm of breast: Secondary | ICD-10-CM | POA: Insufficient documentation

## 2022-10-05 NOTE — Patient Instructions (Signed)
Taught Lindsay Bates about breast self awareness. Patient did not need a Pap smear today due to last Pap smear was in 2022 per patient. Let her know BCCCP will cover Pap smears every 5 years unless has a history of abnormal Pap smears. [facility] Appointment scheduled for 10/05/22. Patient aware of appointment and will be there. Let patient know will follow up with her within the next couple weeks with results. Lindsay Bates verbalized understanding. She will return to clinic in one year for mammogram and 4 years for pap smear.   Melodye Ped, NP 1:28 PM

## 2022-10-05 NOTE — Progress Notes (Signed)
Lindsay Bates is a 56 y.o. female who presents to [facility] clinic today with no complaints.    Pap Smear: Pap not smear completed today. Last Pap smear was 2022 at [facility] clinic and was normal. Per patient has history of an abnormal Pap smear. Last Pap smear result is not available in Epic. 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 Normal pap smears since LEEP.   Physical exam: Breasts Breasts symmetrical. No skin abnormalities bilateral breasts. No nipple retraction bilateral breasts. No nipple discharge bilateral breasts. No lymphadenopathy. No lumps palpated bilateral breasts.     MS DIGITAL SCREENING TOMO BILATERAL  Result Date: 06/26/2021 CLINICAL DATA:  Screening. EXAM: DIGITAL SCREENING BILATERAL MAMMOGRAM WITH TOMOSYNTHESIS AND CAD TECHNIQUE: Bilateral screening digital craniocaudal and mediolateral oblique mammograms were obtained. Bilateral screening digital breast tomosynthesis was performed. The images were evaluated with computer-aided detection. COMPARISON:  Previous exam(s). ACR Breast Density Category c: The breast tissue is heterogeneously dense, which may obscure small masses. FINDINGS: There are no findings suspicious for malignancy. IMPRESSION: No mammographic evidence of malignancy. A result letter of this screening mammogram will be mailed directly to the patient. RECOMMENDATION: Screening mammogram in one year. (Code:SM-B-01Y) BI-RADS CATEGORY  1: Negative. Electronically Signed   By: Everlean Alstrom M.D.   On: 06/26/2021 14:51  MS DIGITAL SCREENING TOMO BILATERAL  Result Date: 11/04/2017 CLINICAL DATA:  Screening. EXAM: 2D DIGITAL SCREENING BILATERAL MAMMOGRAM WITH CAD AND ADJUNCT TOMO COMPARISON:  Previous exam(s). ACR Breast Density Category c: The breast tissue is heterogeneously dense, which may obscure small masses. FINDINGS: There are no findings suspicious for malignancy. Images were processed with CAD. IMPRESSION: No  mammographic evidence of malignancy. A result letter of this screening mammogram will be mailed directly to the patient. RECOMMENDATION: Screening mammogram in one year. (Code:SM-B-01Y) BI-RADS CATEGORY  1: Negative. Electronically Signed   By: Curlene Dolphin M.D.   On: 11/04/2017 09:10      Pelvic/Bimanual Pap is not indicated today    Smoking History: Patient has never smoked and was referred to quit line.    Patient Navigation: Patient education provided. Access to services provided for patient through [facility] interpreter provided. No transportation provided   Colorectal Cancer Screening: Per patient has never had colonoscopy completed No complaints today. FIT test negative in July 2023   Breast and Cervical Cancer Risk Assessment: Patient does not have family history of breast cancer, known genetic mutations, or radiation treatment to the chest before age 34. Patient does not have history of cervical dysplasia, immunocompromised, or DES exposure in-utero.  Risk Assessment     Risk Scores       10/05/2022 06/25/2021   Last edited by: Demetrius Revel, LPN Theodore Demark, RN   5-year risk: 0.7 % 0.6 %   Lifetime risk: 4.7 % 4.9 %            A: BCCCP exam without pap smear No complaints with benign exam.   P: Referred patient to the [facility] for a screening mammogram. Appointment scheduled 10/05/2022.  Dayton Scrape A, NP 10/05/2022 1:24 PM 10/05/2022

## 2023-12-08 ENCOUNTER — Other Ambulatory Visit: Payer: Self-pay

## 2023-12-08 DIAGNOSIS — Z1231 Encounter for screening mammogram for malignant neoplasm of breast: Secondary | ICD-10-CM

## 2024-01-04 ENCOUNTER — Ambulatory Visit
Admission: RE | Admit: 2024-01-04 | Discharge: 2024-01-04 | Disposition: A | Discharge status: Home / Self Care | Payer: Self-pay | Source: Ambulatory Visit | Attending: Obstetrics and Gynecology | Admitting: Obstetrics and Gynecology

## 2024-01-04 ENCOUNTER — Ambulatory Visit: Payer: Self-pay | Attending: Hematology and Oncology | Admitting: *Deleted

## 2024-01-04 VITALS — BP 119/84 | Wt 155.0 lb

## 2024-01-04 DIAGNOSIS — Z1231 Encounter for screening mammogram for malignant neoplasm of breast: Secondary | ICD-10-CM | POA: Insufficient documentation

## 2024-01-04 DIAGNOSIS — Z1239 Encounter for other screening for malignant neoplasm of breast: Secondary | ICD-10-CM

## 2024-01-04 NOTE — Progress Notes (Signed)
 Ms. Lindsay Bates is a 58 y.o. female who presents to [facility] clinic today with no complaints.    Pap Smear: Pap smear not completed today. Last Pap smear was 05/20/2021 at [facility] clinic and was normal with negative HPV. Per patient has no history of an abnormal Pap smear. Last Pap smear result is available in Epic.   Physical exam: Breasts Left breast larger than right breast that per patient is normal for her. No skin abnormalities bilateral breasts. No nipple retraction bilateral breasts. No nipple discharge bilateral breasts. No lymphadenopathy. No lumps palpated bilateral breasts. No complaints of pain or tenderness bilateral breasts.      MS DIGITAL SCREENING TOMO BILATERAL Result Date: 10/07/2022 CLINICAL DATA:  Screening. EXAM: DIGITAL SCREENING BILATERAL MAMMOGRAM WITH TOMOSYNTHESIS AND CAD TECHNIQUE: Bilateral screening digital craniocaudal and mediolateral oblique mammograms were obtained. Bilateral screening digital breast tomosynthesis was performed. The images were evaluated with computer-aided detection. COMPARISON:  Previous exam(s). ACR Breast Density Category c: The breast tissue is heterogeneously dense, which may obscure small masses. FINDINGS: There are no findings suspicious for malignancy. IMPRESSION: No mammographic evidence of malignancy. A result letter of this screening mammogram will be mailed directly to the patient. RECOMMENDATION: Screening mammogram in one year. (Code:SM-B-01Y) BI-RADS CATEGORY  1: Negative. Electronically Signed   By: Reyes Phi M.D.   On: 10/07/2022 10:46   MS DIGITAL SCREENING TOMO BILATERAL Result Date: 06/26/2021 CLINICAL DATA:  Screening. EXAM: DIGITAL SCREENING BILATERAL MAMMOGRAM WITH TOMOSYNTHESIS AND CAD TECHNIQUE: Bilateral screening digital craniocaudal and mediolateral oblique mammograms were obtained. Bilateral screening digital breast tomosynthesis was performed. The images were evaluated with computer-aided detection. COMPARISON:   Previous exam(s). ACR Breast Density Category c: The breast tissue is heterogeneously dense, which may obscure small masses. FINDINGS: There are no findings suspicious for malignancy. IMPRESSION: No mammographic evidence of malignancy. A result letter of this screening mammogram will be mailed directly to the patient. RECOMMENDATION: Screening mammogram in one year. (Code:SM-B-01Y) BI-RADS CATEGORY  1: Negative. Electronically Signed   By: Delon Music M.D.   On: 06/26/2021 14:51   Pelvic/Bimanual Pap is not indicated today per BCCCP guidelines.   Smoking History: Patient has never smoked.   Patient Navigation: Patient education provided. Access to services provided for patient through [facility] program. Spanish interpreter Lindsay Bates from [facility] provided.   Colorectal Cancer Screening: Per patient has never had colonoscopy completed. Per patient completed a FIT test given by her PCP at [facility] 2 months ago that was negative. No complaints today.    Breast and Cervical Cancer Risk Assessment: Patient does not have family history of breast cancer, known genetic mutations, or radiation treatment to the chest before age 66. Patient does not have history of cervical dysplasia, immunocompromised, or DES exposure in-utero.  Risk Scores as of Encounter on 01/04/2024     Lindsay Bates           5-year 1%   Lifetime 6.6%   This patient is [place] but has no documented birth country, so the [place] model used data from [place] patients to calculate their risk score. Document a birth country in the Demographics activity for a more accurate score.         Last calculated by Lindsay Bates, Lindsay Bates, CMA on 01/04/2024 at  3:05 PM        A: BCCCP exam without pap smear No complaints.  P: Referred patient to the [facility] for a screening mammogram. Appointment scheduled Tuesday, January 04, 2024 at  1540.  Driscilla Wanda SQUIBB, RN 01/04/2024 3:10 PM

## 2024-01-04 NOTE — Patient Instructions (Signed)
 Explained breast self awareness with Maida Guan. Patient did not need a Pap smear today due to last Pap smear and HPV typing was 05/20/2021. Let her know BCCCP will cover Pap smears and HPV typing every 5 years unless has a history of abnormal Pap smears. Referred patient to the [facility] for a screening mammogram. Appointment scheduled Tuesday, January 04, 2024 at 1540. Patient aware of appointment and will be there. Let patient know [facility] will follow up with her within the next couple weeks with results of mammogram by letter or phone. Maida Guan verbalized understanding.  Sylvio Weatherall, Wanda Ship, RN 3:10 PM
